# Patient Record
Sex: Female | Born: 1945 | Race: Black or African American | Hispanic: No | Smoking: Never smoker
Health system: Southern US, Community
[De-identification: ages and names within clinical notes are randomized; demographics above are authoritative.]

## PROBLEM LIST (undated history)

## (undated) DIAGNOSIS — C50911 Malignant neoplasm of unspecified site of right female breast: Secondary | ICD-10-CM

## (undated) DIAGNOSIS — E785 Hyperlipidemia, unspecified: Secondary | ICD-10-CM

## (undated) DIAGNOSIS — I10 Essential (primary) hypertension: Secondary | ICD-10-CM

## (undated) DIAGNOSIS — I2510 Atherosclerotic heart disease of native coronary artery without angina pectoris: Secondary | ICD-10-CM

## (undated) DIAGNOSIS — Z9861 Coronary angioplasty status: Secondary | ICD-10-CM

## (undated) HISTORY — DX: Malignant neoplasm of unspecified site of right female breast: C50.911

## (undated) HISTORY — DX: Atherosclerotic heart disease of native coronary artery without angina pectoris: Z98.61

## (undated) HISTORY — PX: TONSILLECTOMY: SUR1361

## (undated) HISTORY — DX: Atherosclerotic heart disease of native coronary artery without angina pectoris: I25.10

---

## 2011-08-23 ENCOUNTER — Emergency Department (HOSPITAL_BASED_OUTPATIENT_CLINIC_OR_DEPARTMENT_OTHER)
Admission: EM | Admit: 2011-08-23 | Discharge: 2011-08-23 | Disposition: A | Discharge status: Home / Self Care | Payer: BC Managed Care – PPO | Attending: Emergency Medicine | Admitting: Emergency Medicine

## 2011-08-23 ENCOUNTER — Encounter (HOSPITAL_BASED_OUTPATIENT_CLINIC_OR_DEPARTMENT_OTHER): Payer: Self-pay | Admitting: *Deleted

## 2011-08-23 DIAGNOSIS — H6690 Otitis media, unspecified, unspecified ear: Secondary | ICD-10-CM | POA: Insufficient documentation

## 2011-08-23 DIAGNOSIS — E119 Type 2 diabetes mellitus without complications: Secondary | ICD-10-CM | POA: Insufficient documentation

## 2011-08-23 DIAGNOSIS — H6692 Otitis media, unspecified, left ear: Secondary | ICD-10-CM

## 2011-08-23 DIAGNOSIS — J069 Acute upper respiratory infection, unspecified: Secondary | ICD-10-CM

## 2011-08-23 DIAGNOSIS — Z79899 Other long term (current) drug therapy: Secondary | ICD-10-CM | POA: Insufficient documentation

## 2011-08-23 DIAGNOSIS — I10 Essential (primary) hypertension: Secondary | ICD-10-CM | POA: Insufficient documentation

## 2011-08-23 DIAGNOSIS — E785 Hyperlipidemia, unspecified: Secondary | ICD-10-CM | POA: Insufficient documentation

## 2011-08-23 HISTORY — DX: Essential (primary) hypertension: I10

## 2011-08-23 HISTORY — DX: Hyperlipidemia, unspecified: E78.5

## 2011-08-23 MED ORDER — ANTIPYRINE-BENZOCAINE 5.4-1.4 % OT SOLN: 3.0000 [drp] | OTIC | Status: AC | PRN

## 2011-08-23 NOTE — ED Notes (Signed)
Pt c/o sore throat and bil ear pain x 2 days

## 2011-08-23 NOTE — ED Provider Notes (Signed)
History     CSN: [id number]  Arrival date & time 08/23/11  4098   First MD Initiated Contact with Patient 08/23/11 2120      Chief Complaint  Patient presents with  . Otalgia  . Sore Throat    (Consider location/radiation/quality/duration/timing/severity/associated sxs/prior treatment) HPI Patient presents with complaint of sore throat and left-sided ear pain. She states her symptoms began approximately 2 days ago. The pain in her left ear was worse last night. It is described as constant and sharp. She denies any fever. She has had no cough or difficulty breathing or difficulty swallowing. Patient states that she had Zpack which her physician had given her for another separate illness. She states that she took the first dose last night and a second dose today.  There are no other associated systemic symptoms.  There are no alleviating or modifying factors.   Past Medical History  Diagnosis Date  . Hypertension   . Diabetes mellitus   . Hyperlipemia     Past Surgical History  Procedure Date  . Tonsillectomy     History reviewed. No pertinent family history.  History  Substance Use Topics  . Smoking status: Never Smoker   . Smokeless tobacco: Not on file  . Alcohol Use: No    OB History    Grav Para Term Preterm Abortions TAB SAB Ect Mult Living                  Review of Systems ROS reviewed and otherwise negative except for mentioned in HPI  Allergies  Review of patient's allergies indicates no known allergies.  Home Medications   Current Outpatient Rx  Name Route Sig Dispense Refill  . AZITHROMYCIN 250 MG PO TABS Oral Take 250 mg by mouth daily.    Marland Kitchen CALCIUM CARBONATE-VITAMIN D 600-400 MG-UNIT PO TABS Oral Take 1 tablet by mouth daily.    Marland Kitchen CINNAMON 500 MG PO TABS Oral Take 500 mg by mouth daily.    . OMEGA-3 FATTY ACIDS 1000 MG PO CAPS Oral Take 1 g by mouth daily.    Marland Kitchen GLIMEPIRIDE 2 MG PO TABS Oral Take 2 mg by mouth 2 (two) times daily.    Marland Kitchen  LEVOTHYROXINE SODIUM 137 MCG PO TABS Oral Take 137 mcg by mouth daily.    Marland Kitchen LORATADINE 10 MG PO TABS Oral Take 10 mg by mouth daily as needed. For allergies    . LOSARTAN POTASSIUM-HCTZ 100-25 MG PO TABS Oral Take 1 tablet by mouth daily.    Marland Kitchen POTASSIUM CHLORIDE ER 10 MEQ PO TBCR Oral Take 10 mEq by mouth 2 (two) times daily.    Marland Kitchen SIMVASTATIN 20 MG PO TABS Oral Take 20 mg by mouth every evening.    Marland Kitchen VERAPAMIL HCL ER (CO) 180 MG PO TB24 Oral Take 180 mg by mouth daily.    Marland Kitchen VITAMIN E 400 UNITS PO CAPS Oral Take 400 Units by mouth daily.    . ANTIPYRINE-BENZOCAINE 5.4-1.4 % OT SOLN Left Ear Place 3 drops into the left ear every 2 (two) hours as needed for pain. 10 mL 0    BP 168/76  Pulse 86  Temp(Src) 98.3 F (36.8 C) (Oral)  Resp 16  Ht 5\' 5"  (1.651 m)  Wt 230 lb (104.327 kg)  BMI 38.27 kg/m2  SpO2 98% Vitals reviewed Physical Exam Physical Examination: General appearance - alert, well appearing, and in no distress Mental status - alert, oriented to person, place, and time Eyes - pupils  equal and reactive, extraocular eye movements intact Ears - left TM with erythema, pus, bulging, decreased landmarks, right TM, bilateral EACs normal Mouth - MMM, mild erythema of OP, palate symmetric, uvula midline Neck - supple, no significant adenopathy Chest - clear to auscultation, no wheezes, rales or rhonchi, symmetric air entry Heart - normal rate, regular rhythm, normal S1, S2, no murmurs, rubs, clicks or gallops Abdomen - soft, nontender, nondistended, no masses or organomegaly Extremities - peripheral pulses normal, no pedal edema, no clubbing or cyanosis Skin - normal coloration and turgor, no rashes  ED Course  Procedures (including critical care time)   Labs Reviewed  RAPID STREP SCREEN   No results found.   1. Otitis media of left ear   2. Upper respiratory infection       MDM  Patient presents with complaint of sore throat and ear pain. She has evidence of an acute  otitis media on the left. Her rapid strep screen was checked and is negative. Patient is already taking Zithromax which she received for another illness today is day #2 on her course. Will discharge her with pain medication and Auralgan for her ear pain and to finish the course of Zithromax. She was given strict return precautions and she is agreeable with this plan        Ethelda Chick, MD 08/24/11 1505

## 2013-02-15 DIAGNOSIS — I213 ST elevation (STEMI) myocardial infarction of unspecified site: Secondary | ICD-10-CM | POA: Insufficient documentation

## 2014-08-12 DIAGNOSIS — Z90710 Acquired absence of both cervix and uterus: Secondary | ICD-10-CM | POA: Insufficient documentation

## 2015-03-20 DIAGNOSIS — E119 Type 2 diabetes mellitus without complications: Secondary | ICD-10-CM | POA: Insufficient documentation

## 2015-03-20 DIAGNOSIS — E1165 Type 2 diabetes mellitus with hyperglycemia: Secondary | ICD-10-CM | POA: Insufficient documentation

## 2015-03-20 DIAGNOSIS — M65331 Trigger finger, right middle finger: Secondary | ICD-10-CM | POA: Insufficient documentation

## 2017-10-23 ENCOUNTER — Other Ambulatory Visit: Payer: Self-pay | Admitting: Internal Medicine

## 2017-10-23 DIAGNOSIS — R944 Abnormal results of kidney function studies: Secondary | ICD-10-CM

## 2017-10-26 ENCOUNTER — Ambulatory Visit
Admission: RE | Admit: 2017-10-26 | Discharge: 2017-10-26 | Disposition: A | Discharge status: Home / Self Care | Payer: Medicare HMO | Source: Ambulatory Visit | Attending: Internal Medicine | Admitting: Internal Medicine

## 2017-10-26 DIAGNOSIS — R944 Abnormal results of kidney function studies: Secondary | ICD-10-CM | POA: Insufficient documentation

## 2020-05-29 DIAGNOSIS — C50311 Malignant neoplasm of lower-inner quadrant of right female breast: Secondary | ICD-10-CM | POA: Insufficient documentation

## 2020-07-18 DIAGNOSIS — T451X5A Adverse effect of antineoplastic and immunosuppressive drugs, initial encounter: Secondary | ICD-10-CM | POA: Insufficient documentation

## 2020-07-18 DIAGNOSIS — R739 Hyperglycemia, unspecified: Secondary | ICD-10-CM | POA: Insufficient documentation

## 2020-09-25 DIAGNOSIS — E876 Hypokalemia: Secondary | ICD-10-CM | POA: Insufficient documentation

## 2020-12-08 DIAGNOSIS — E538 Deficiency of other specified B group vitamins: Secondary | ICD-10-CM | POA: Insufficient documentation

## 2020-12-10 DIAGNOSIS — E538 Deficiency of other specified B group vitamins: Secondary | ICD-10-CM | POA: Diagnosis not present

## 2020-12-10 DIAGNOSIS — C50311 Malignant neoplasm of lower-inner quadrant of right female breast: Secondary | ICD-10-CM | POA: Diagnosis not present

## 2020-12-10 DIAGNOSIS — R6889 Other general symptoms and signs: Secondary | ICD-10-CM | POA: Diagnosis not present

## 2020-12-10 DIAGNOSIS — Z510 Encounter for antineoplastic radiation therapy: Secondary | ICD-10-CM | POA: Diagnosis not present

## 2020-12-10 DIAGNOSIS — Z170 Estrogen receptor positive status [ER+]: Secondary | ICD-10-CM | POA: Diagnosis not present

## 2020-12-11 DIAGNOSIS — Z95828 Presence of other vascular implants and grafts: Secondary | ICD-10-CM | POA: Diagnosis not present

## 2020-12-11 DIAGNOSIS — E538 Deficiency of other specified B group vitamins: Secondary | ICD-10-CM | POA: Diagnosis not present

## 2020-12-11 DIAGNOSIS — C50311 Malignant neoplasm of lower-inner quadrant of right female breast: Secondary | ICD-10-CM | POA: Diagnosis not present

## 2020-12-11 DIAGNOSIS — Z170 Estrogen receptor positive status [ER+]: Secondary | ICD-10-CM | POA: Diagnosis not present

## 2020-12-14 DIAGNOSIS — C50311 Malignant neoplasm of lower-inner quadrant of right female breast: Secondary | ICD-10-CM | POA: Diagnosis not present

## 2020-12-14 DIAGNOSIS — Z170 Estrogen receptor positive status [ER+]: Secondary | ICD-10-CM | POA: Diagnosis not present

## 2020-12-14 DIAGNOSIS — Z510 Encounter for antineoplastic radiation therapy: Secondary | ICD-10-CM | POA: Diagnosis not present

## 2020-12-14 DIAGNOSIS — E538 Deficiency of other specified B group vitamins: Secondary | ICD-10-CM | POA: Diagnosis not present

## 2020-12-15 DIAGNOSIS — C50311 Malignant neoplasm of lower-inner quadrant of right female breast: Secondary | ICD-10-CM | POA: Diagnosis not present

## 2020-12-15 DIAGNOSIS — Z170 Estrogen receptor positive status [ER+]: Secondary | ICD-10-CM | POA: Diagnosis not present

## 2020-12-15 DIAGNOSIS — E538 Deficiency of other specified B group vitamins: Secondary | ICD-10-CM | POA: Diagnosis not present

## 2020-12-15 DIAGNOSIS — Z510 Encounter for antineoplastic radiation therapy: Secondary | ICD-10-CM | POA: Diagnosis not present

## 2020-12-16 DIAGNOSIS — C50311 Malignant neoplasm of lower-inner quadrant of right female breast: Secondary | ICD-10-CM | POA: Diagnosis not present

## 2020-12-16 DIAGNOSIS — Z170 Estrogen receptor positive status [ER+]: Secondary | ICD-10-CM | POA: Diagnosis not present

## 2020-12-16 DIAGNOSIS — Z510 Encounter for antineoplastic radiation therapy: Secondary | ICD-10-CM | POA: Diagnosis not present

## 2020-12-17 DIAGNOSIS — Z170 Estrogen receptor positive status [ER+]: Secondary | ICD-10-CM | POA: Diagnosis not present

## 2020-12-17 DIAGNOSIS — C50311 Malignant neoplasm of lower-inner quadrant of right female breast: Secondary | ICD-10-CM | POA: Diagnosis not present

## 2020-12-17 DIAGNOSIS — Z510 Encounter for antineoplastic radiation therapy: Secondary | ICD-10-CM | POA: Diagnosis not present

## 2020-12-17 DIAGNOSIS — E538 Deficiency of other specified B group vitamins: Secondary | ICD-10-CM | POA: Diagnosis not present

## 2020-12-18 DIAGNOSIS — C50311 Malignant neoplasm of lower-inner quadrant of right female breast: Secondary | ICD-10-CM | POA: Diagnosis not present

## 2020-12-18 DIAGNOSIS — Z510 Encounter for antineoplastic radiation therapy: Secondary | ICD-10-CM | POA: Diagnosis not present

## 2020-12-18 DIAGNOSIS — Z170 Estrogen receptor positive status [ER+]: Secondary | ICD-10-CM | POA: Diagnosis not present

## 2020-12-21 DIAGNOSIS — Z170 Estrogen receptor positive status [ER+]: Secondary | ICD-10-CM | POA: Diagnosis not present

## 2020-12-21 DIAGNOSIS — Z510 Encounter for antineoplastic radiation therapy: Secondary | ICD-10-CM | POA: Diagnosis not present

## 2020-12-21 DIAGNOSIS — C50311 Malignant neoplasm of lower-inner quadrant of right female breast: Secondary | ICD-10-CM | POA: Diagnosis not present

## 2020-12-22 DIAGNOSIS — C50311 Malignant neoplasm of lower-inner quadrant of right female breast: Secondary | ICD-10-CM | POA: Diagnosis not present

## 2020-12-22 DIAGNOSIS — Z170 Estrogen receptor positive status [ER+]: Secondary | ICD-10-CM | POA: Diagnosis not present

## 2020-12-22 DIAGNOSIS — Z510 Encounter for antineoplastic radiation therapy: Secondary | ICD-10-CM | POA: Diagnosis not present

## 2020-12-23 DIAGNOSIS — Z170 Estrogen receptor positive status [ER+]: Secondary | ICD-10-CM | POA: Diagnosis not present

## 2020-12-23 DIAGNOSIS — C50311 Malignant neoplasm of lower-inner quadrant of right female breast: Secondary | ICD-10-CM | POA: Diagnosis not present

## 2020-12-23 DIAGNOSIS — Z510 Encounter for antineoplastic radiation therapy: Secondary | ICD-10-CM | POA: Diagnosis not present

## 2020-12-24 DIAGNOSIS — Z510 Encounter for antineoplastic radiation therapy: Secondary | ICD-10-CM | POA: Diagnosis not present

## 2020-12-24 DIAGNOSIS — C50311 Malignant neoplasm of lower-inner quadrant of right female breast: Secondary | ICD-10-CM | POA: Diagnosis not present

## 2020-12-24 DIAGNOSIS — Z170 Estrogen receptor positive status [ER+]: Secondary | ICD-10-CM | POA: Diagnosis not present

## 2020-12-24 DIAGNOSIS — E538 Deficiency of other specified B group vitamins: Secondary | ICD-10-CM | POA: Diagnosis not present

## 2020-12-25 DIAGNOSIS — Z170 Estrogen receptor positive status [ER+]: Secondary | ICD-10-CM | POA: Diagnosis not present

## 2020-12-25 DIAGNOSIS — Z510 Encounter for antineoplastic radiation therapy: Secondary | ICD-10-CM | POA: Diagnosis not present

## 2020-12-25 DIAGNOSIS — C50311 Malignant neoplasm of lower-inner quadrant of right female breast: Secondary | ICD-10-CM | POA: Diagnosis not present

## 2020-12-28 DIAGNOSIS — Z170 Estrogen receptor positive status [ER+]: Secondary | ICD-10-CM | POA: Diagnosis not present

## 2020-12-28 DIAGNOSIS — Z01810 Encounter for preprocedural cardiovascular examination: Secondary | ICD-10-CM | POA: Diagnosis not present

## 2020-12-28 DIAGNOSIS — Z510 Encounter for antineoplastic radiation therapy: Secondary | ICD-10-CM | POA: Diagnosis not present

## 2020-12-28 DIAGNOSIS — C50311 Malignant neoplasm of lower-inner quadrant of right female breast: Secondary | ICD-10-CM | POA: Diagnosis not present

## 2020-12-29 DIAGNOSIS — Z01810 Encounter for preprocedural cardiovascular examination: Secondary | ICD-10-CM | POA: Diagnosis not present

## 2020-12-29 DIAGNOSIS — C50311 Malignant neoplasm of lower-inner quadrant of right female breast: Secondary | ICD-10-CM | POA: Diagnosis not present

## 2020-12-29 DIAGNOSIS — Z170 Estrogen receptor positive status [ER+]: Secondary | ICD-10-CM | POA: Diagnosis not present

## 2020-12-29 DIAGNOSIS — Z510 Encounter for antineoplastic radiation therapy: Secondary | ICD-10-CM | POA: Diagnosis not present

## 2020-12-30 DIAGNOSIS — Z170 Estrogen receptor positive status [ER+]: Secondary | ICD-10-CM | POA: Diagnosis not present

## 2020-12-30 DIAGNOSIS — M65331 Trigger finger, right middle finger: Secondary | ICD-10-CM | POA: Diagnosis not present

## 2020-12-30 DIAGNOSIS — C50311 Malignant neoplasm of lower-inner quadrant of right female breast: Secondary | ICD-10-CM | POA: Diagnosis not present

## 2020-12-30 DIAGNOSIS — Z510 Encounter for antineoplastic radiation therapy: Secondary | ICD-10-CM | POA: Diagnosis not present

## 2020-12-31 DIAGNOSIS — C50311 Malignant neoplasm of lower-inner quadrant of right female breast: Secondary | ICD-10-CM | POA: Diagnosis not present

## 2020-12-31 DIAGNOSIS — Z510 Encounter for antineoplastic radiation therapy: Secondary | ICD-10-CM | POA: Diagnosis not present

## 2020-12-31 DIAGNOSIS — Z170 Estrogen receptor positive status [ER+]: Secondary | ICD-10-CM | POA: Diagnosis not present

## 2021-01-01 DIAGNOSIS — Z170 Estrogen receptor positive status [ER+]: Secondary | ICD-10-CM | POA: Diagnosis not present

## 2021-01-01 DIAGNOSIS — C50311 Malignant neoplasm of lower-inner quadrant of right female breast: Secondary | ICD-10-CM | POA: Diagnosis not present

## 2021-01-01 DIAGNOSIS — E538 Deficiency of other specified B group vitamins: Secondary | ICD-10-CM | POA: Diagnosis not present

## 2021-01-01 DIAGNOSIS — Z95828 Presence of other vascular implants and grafts: Secondary | ICD-10-CM | POA: Diagnosis not present

## 2021-01-01 DIAGNOSIS — Z510 Encounter for antineoplastic radiation therapy: Secondary | ICD-10-CM | POA: Diagnosis not present

## 2021-01-04 DIAGNOSIS — Z510 Encounter for antineoplastic radiation therapy: Secondary | ICD-10-CM | POA: Diagnosis not present

## 2021-01-04 DIAGNOSIS — C50311 Malignant neoplasm of lower-inner quadrant of right female breast: Secondary | ICD-10-CM | POA: Diagnosis not present

## 2021-01-04 DIAGNOSIS — Z170 Estrogen receptor positive status [ER+]: Secondary | ICD-10-CM | POA: Diagnosis not present

## 2021-01-05 DIAGNOSIS — C50311 Malignant neoplasm of lower-inner quadrant of right female breast: Secondary | ICD-10-CM | POA: Diagnosis not present

## 2021-01-05 DIAGNOSIS — Z170 Estrogen receptor positive status [ER+]: Secondary | ICD-10-CM | POA: Diagnosis not present

## 2021-01-05 DIAGNOSIS — Z510 Encounter for antineoplastic radiation therapy: Secondary | ICD-10-CM | POA: Diagnosis not present

## 2021-01-06 DIAGNOSIS — Z510 Encounter for antineoplastic radiation therapy: Secondary | ICD-10-CM | POA: Diagnosis not present

## 2021-01-06 DIAGNOSIS — Z170 Estrogen receptor positive status [ER+]: Secondary | ICD-10-CM | POA: Diagnosis not present

## 2021-01-06 DIAGNOSIS — C50311 Malignant neoplasm of lower-inner quadrant of right female breast: Secondary | ICD-10-CM | POA: Diagnosis not present

## 2021-01-07 DIAGNOSIS — Z510 Encounter for antineoplastic radiation therapy: Secondary | ICD-10-CM | POA: Diagnosis not present

## 2021-01-07 DIAGNOSIS — Z170 Estrogen receptor positive status [ER+]: Secondary | ICD-10-CM | POA: Diagnosis not present

## 2021-01-07 DIAGNOSIS — C50311 Malignant neoplasm of lower-inner quadrant of right female breast: Secondary | ICD-10-CM | POA: Diagnosis not present

## 2021-01-08 DIAGNOSIS — C50811 Malignant neoplasm of overlapping sites of right female breast: Secondary | ICD-10-CM | POA: Diagnosis not present

## 2021-01-08 DIAGNOSIS — Z510 Encounter for antineoplastic radiation therapy: Secondary | ICD-10-CM | POA: Diagnosis not present

## 2021-01-08 DIAGNOSIS — C50311 Malignant neoplasm of lower-inner quadrant of right female breast: Secondary | ICD-10-CM | POA: Diagnosis not present

## 2021-01-08 DIAGNOSIS — E538 Deficiency of other specified B group vitamins: Secondary | ICD-10-CM | POA: Diagnosis not present

## 2021-01-08 DIAGNOSIS — Z170 Estrogen receptor positive status [ER+]: Secondary | ICD-10-CM | POA: Diagnosis not present

## 2021-01-15 DIAGNOSIS — E538 Deficiency of other specified B group vitamins: Secondary | ICD-10-CM | POA: Diagnosis not present

## 2021-01-18 DIAGNOSIS — Z510 Encounter for antineoplastic radiation therapy: Secondary | ICD-10-CM | POA: Diagnosis not present

## 2021-01-18 DIAGNOSIS — Z170 Estrogen receptor positive status [ER+]: Secondary | ICD-10-CM | POA: Diagnosis not present

## 2021-01-18 DIAGNOSIS — C50311 Malignant neoplasm of lower-inner quadrant of right female breast: Secondary | ICD-10-CM | POA: Diagnosis not present

## 2021-01-22 DIAGNOSIS — Z170 Estrogen receptor positive status [ER+]: Secondary | ICD-10-CM | POA: Diagnosis not present

## 2021-01-22 DIAGNOSIS — E538 Deficiency of other specified B group vitamins: Secondary | ICD-10-CM | POA: Diagnosis not present

## 2021-01-22 DIAGNOSIS — C50311 Malignant neoplasm of lower-inner quadrant of right female breast: Secondary | ICD-10-CM | POA: Diagnosis not present

## 2021-01-22 DIAGNOSIS — Z95828 Presence of other vascular implants and grafts: Secondary | ICD-10-CM | POA: Diagnosis not present

## 2021-01-28 DIAGNOSIS — M21611 Bunion of right foot: Secondary | ICD-10-CM | POA: Diagnosis not present

## 2021-01-29 DIAGNOSIS — E538 Deficiency of other specified B group vitamins: Secondary | ICD-10-CM | POA: Diagnosis not present

## 2021-02-04 DIAGNOSIS — E538 Deficiency of other specified B group vitamins: Secondary | ICD-10-CM | POA: Diagnosis not present

## 2021-02-12 DIAGNOSIS — Z5111 Encounter for antineoplastic chemotherapy: Secondary | ICD-10-CM | POA: Diagnosis not present

## 2021-02-12 DIAGNOSIS — C50311 Malignant neoplasm of lower-inner quadrant of right female breast: Secondary | ICD-10-CM | POA: Diagnosis not present

## 2021-02-12 DIAGNOSIS — Z95828 Presence of other vascular implants and grafts: Secondary | ICD-10-CM | POA: Diagnosis not present

## 2021-02-12 DIAGNOSIS — C170 Malignant neoplasm of duodenum: Secondary | ICD-10-CM | POA: Diagnosis not present

## 2021-02-19 DIAGNOSIS — E039 Hypothyroidism, unspecified: Secondary | ICD-10-CM | POA: Diagnosis not present

## 2021-02-19 DIAGNOSIS — I2510 Atherosclerotic heart disease of native coronary artery without angina pectoris: Secondary | ICD-10-CM | POA: Diagnosis not present

## 2021-02-19 DIAGNOSIS — E139 Other specified diabetes mellitus without complications: Secondary | ICD-10-CM | POA: Diagnosis not present

## 2021-02-19 DIAGNOSIS — I10 Essential (primary) hypertension: Secondary | ICD-10-CM | POA: Diagnosis not present

## 2021-02-19 DIAGNOSIS — E782 Mixed hyperlipidemia: Secondary | ICD-10-CM | POA: Diagnosis not present

## 2021-02-19 DIAGNOSIS — E538 Deficiency of other specified B group vitamins: Secondary | ICD-10-CM | POA: Diagnosis not present

## 2021-03-10 DIAGNOSIS — Z170 Estrogen receptor positive status [ER+]: Secondary | ICD-10-CM | POA: Diagnosis not present

## 2021-03-10 DIAGNOSIS — G629 Polyneuropathy, unspecified: Secondary | ICD-10-CM | POA: Diagnosis not present

## 2021-03-10 DIAGNOSIS — Z95828 Presence of other vascular implants and grafts: Secondary | ICD-10-CM | POA: Diagnosis not present

## 2021-03-10 DIAGNOSIS — C50311 Malignant neoplasm of lower-inner quadrant of right female breast: Secondary | ICD-10-CM | POA: Diagnosis not present

## 2021-03-25 DIAGNOSIS — R5383 Other fatigue: Secondary | ICD-10-CM | POA: Diagnosis not present

## 2021-03-25 DIAGNOSIS — I10 Essential (primary) hypertension: Secondary | ICD-10-CM | POA: Diagnosis not present

## 2021-03-25 DIAGNOSIS — F411 Generalized anxiety disorder: Secondary | ICD-10-CM | POA: Diagnosis not present

## 2021-03-25 DIAGNOSIS — R0602 Shortness of breath: Secondary | ICD-10-CM | POA: Diagnosis not present

## 2021-03-25 DIAGNOSIS — E039 Hypothyroidism, unspecified: Secondary | ICD-10-CM | POA: Diagnosis not present

## 2021-03-25 DIAGNOSIS — I2510 Atherosclerotic heart disease of native coronary artery without angina pectoris: Secondary | ICD-10-CM | POA: Diagnosis not present

## 2021-03-25 DIAGNOSIS — C50919 Malignant neoplasm of unspecified site of unspecified female breast: Secondary | ICD-10-CM | POA: Diagnosis not present

## 2021-03-25 DIAGNOSIS — E782 Mixed hyperlipidemia: Secondary | ICD-10-CM | POA: Diagnosis not present

## 2021-03-25 DIAGNOSIS — E139 Other specified diabetes mellitus without complications: Secondary | ICD-10-CM | POA: Diagnosis not present

## 2021-03-31 DIAGNOSIS — Z170 Estrogen receptor positive status [ER+]: Secondary | ICD-10-CM | POA: Diagnosis not present

## 2021-03-31 DIAGNOSIS — Z95828 Presence of other vascular implants and grafts: Secondary | ICD-10-CM | POA: Diagnosis not present

## 2021-03-31 DIAGNOSIS — G620 Drug-induced polyneuropathy: Secondary | ICD-10-CM | POA: Diagnosis not present

## 2021-03-31 DIAGNOSIS — C50311 Malignant neoplasm of lower-inner quadrant of right female breast: Secondary | ICD-10-CM | POA: Diagnosis not present

## 2021-04-01 DIAGNOSIS — E139 Other specified diabetes mellitus without complications: Secondary | ICD-10-CM | POA: Diagnosis not present

## 2021-04-01 DIAGNOSIS — I429 Cardiomyopathy, unspecified: Secondary | ICD-10-CM | POA: Diagnosis not present

## 2021-04-01 DIAGNOSIS — R0602 Shortness of breath: Secondary | ICD-10-CM | POA: Diagnosis not present

## 2021-04-01 DIAGNOSIS — I2510 Atherosclerotic heart disease of native coronary artery without angina pectoris: Secondary | ICD-10-CM | POA: Diagnosis not present

## 2021-04-01 DIAGNOSIS — I10 Essential (primary) hypertension: Secondary | ICD-10-CM | POA: Diagnosis not present

## 2021-04-01 DIAGNOSIS — E782 Mixed hyperlipidemia: Secondary | ICD-10-CM | POA: Diagnosis not present

## 2021-04-22 DIAGNOSIS — Z5111 Encounter for antineoplastic chemotherapy: Secondary | ICD-10-CM | POA: Diagnosis not present

## 2021-04-22 DIAGNOSIS — C50311 Malignant neoplasm of lower-inner quadrant of right female breast: Secondary | ICD-10-CM | POA: Diagnosis not present

## 2021-04-22 DIAGNOSIS — Z170 Estrogen receptor positive status [ER+]: Secondary | ICD-10-CM | POA: Diagnosis not present

## 2021-04-22 DIAGNOSIS — Z95828 Presence of other vascular implants and grafts: Secondary | ICD-10-CM | POA: Diagnosis not present

## 2021-04-26 DIAGNOSIS — C50311 Malignant neoplasm of lower-inner quadrant of right female breast: Secondary | ICD-10-CM | POA: Diagnosis not present

## 2021-04-26 DIAGNOSIS — Z853 Personal history of malignant neoplasm of breast: Secondary | ICD-10-CM | POA: Diagnosis not present

## 2021-04-26 DIAGNOSIS — R922 Inconclusive mammogram: Secondary | ICD-10-CM | POA: Diagnosis not present

## 2021-04-26 DIAGNOSIS — Z170 Estrogen receptor positive status [ER+]: Secondary | ICD-10-CM | POA: Diagnosis not present

## 2021-05-03 DIAGNOSIS — Z170 Estrogen receptor positive status [ER+]: Secondary | ICD-10-CM | POA: Diagnosis not present

## 2021-05-03 DIAGNOSIS — Z9221 Personal history of antineoplastic chemotherapy: Secondary | ICD-10-CM | POA: Diagnosis not present

## 2021-05-03 DIAGNOSIS — Z923 Personal history of irradiation: Secondary | ICD-10-CM | POA: Diagnosis not present

## 2021-05-03 DIAGNOSIS — C50311 Malignant neoplasm of lower-inner quadrant of right female breast: Secondary | ICD-10-CM | POA: Diagnosis not present

## 2021-05-13 DIAGNOSIS — C50311 Malignant neoplasm of lower-inner quadrant of right female breast: Secondary | ICD-10-CM | POA: Diagnosis not present

## 2021-05-13 DIAGNOSIS — Z170 Estrogen receptor positive status [ER+]: Secondary | ICD-10-CM | POA: Diagnosis not present

## 2021-05-13 DIAGNOSIS — G620 Drug-induced polyneuropathy: Secondary | ICD-10-CM | POA: Diagnosis not present

## 2021-05-13 DIAGNOSIS — Z95828 Presence of other vascular implants and grafts: Secondary | ICD-10-CM | POA: Diagnosis not present

## 2021-05-28 DIAGNOSIS — E039 Hypothyroidism, unspecified: Secondary | ICD-10-CM | POA: Diagnosis not present

## 2021-05-28 DIAGNOSIS — E139 Other specified diabetes mellitus without complications: Secondary | ICD-10-CM | POA: Diagnosis not present

## 2021-05-28 DIAGNOSIS — I10 Essential (primary) hypertension: Secondary | ICD-10-CM | POA: Diagnosis not present

## 2021-05-28 DIAGNOSIS — I2510 Atherosclerotic heart disease of native coronary artery without angina pectoris: Secondary | ICD-10-CM | POA: Diagnosis not present

## 2021-05-28 DIAGNOSIS — E782 Mixed hyperlipidemia: Secondary | ICD-10-CM | POA: Diagnosis not present

## 2021-06-02 DIAGNOSIS — E538 Deficiency of other specified B group vitamins: Secondary | ICD-10-CM | POA: Diagnosis not present

## 2021-06-02 DIAGNOSIS — Z170 Estrogen receptor positive status [ER+]: Secondary | ICD-10-CM | POA: Diagnosis not present

## 2021-06-02 DIAGNOSIS — Z95828 Presence of other vascular implants and grafts: Secondary | ICD-10-CM | POA: Diagnosis not present

## 2021-06-02 DIAGNOSIS — C50311 Malignant neoplasm of lower-inner quadrant of right female breast: Secondary | ICD-10-CM | POA: Diagnosis not present

## 2021-06-02 DIAGNOSIS — G620 Drug-induced polyneuropathy: Secondary | ICD-10-CM | POA: Diagnosis not present

## 2021-06-29 DIAGNOSIS — Z170 Estrogen receptor positive status [ER+]: Secondary | ICD-10-CM | POA: Diagnosis not present

## 2021-06-29 DIAGNOSIS — Z9889 Other specified postprocedural states: Secondary | ICD-10-CM | POA: Diagnosis not present

## 2021-06-29 DIAGNOSIS — T451X5A Adverse effect of antineoplastic and immunosuppressive drugs, initial encounter: Secondary | ICD-10-CM | POA: Diagnosis not present

## 2021-06-29 DIAGNOSIS — Z95828 Presence of other vascular implants and grafts: Secondary | ICD-10-CM | POA: Diagnosis not present

## 2021-06-29 DIAGNOSIS — C50311 Malignant neoplasm of lower-inner quadrant of right female breast: Secondary | ICD-10-CM | POA: Diagnosis not present

## 2021-06-29 DIAGNOSIS — G620 Drug-induced polyneuropathy: Secondary | ICD-10-CM | POA: Diagnosis not present

## 2021-07-16 DIAGNOSIS — E1136 Type 2 diabetes mellitus with diabetic cataract: Secondary | ICD-10-CM | POA: Diagnosis not present

## 2021-07-16 DIAGNOSIS — H04123 Dry eye syndrome of bilateral lacrimal glands: Secondary | ICD-10-CM | POA: Diagnosis not present

## 2021-07-16 DIAGNOSIS — H2513 Age-related nuclear cataract, bilateral: Secondary | ICD-10-CM | POA: Diagnosis not present

## 2021-07-19 DIAGNOSIS — Z170 Estrogen receptor positive status [ER+]: Secondary | ICD-10-CM | POA: Diagnosis not present

## 2021-07-19 DIAGNOSIS — I2510 Atherosclerotic heart disease of native coronary artery without angina pectoris: Secondary | ICD-10-CM | POA: Diagnosis not present

## 2021-07-19 DIAGNOSIS — E039 Hypothyroidism, unspecified: Secondary | ICD-10-CM | POA: Diagnosis not present

## 2021-07-19 DIAGNOSIS — E119 Type 2 diabetes mellitus without complications: Secondary | ICD-10-CM | POA: Diagnosis not present

## 2021-07-19 DIAGNOSIS — Z9221 Personal history of antineoplastic chemotherapy: Secondary | ICD-10-CM | POA: Diagnosis not present

## 2021-07-19 DIAGNOSIS — C50311 Malignant neoplasm of lower-inner quadrant of right female breast: Secondary | ICD-10-CM | POA: Diagnosis not present

## 2021-07-19 DIAGNOSIS — Z452 Encounter for adjustment and management of vascular access device: Secondary | ICD-10-CM | POA: Diagnosis not present

## 2021-07-19 DIAGNOSIS — I10 Essential (primary) hypertension: Secondary | ICD-10-CM | POA: Diagnosis not present

## 2021-07-19 DIAGNOSIS — Z87891 Personal history of nicotine dependence: Secondary | ICD-10-CM | POA: Diagnosis not present

## 2021-07-19 DIAGNOSIS — C50919 Malignant neoplasm of unspecified site of unspecified female breast: Secondary | ICD-10-CM | POA: Diagnosis not present

## 2021-07-30 DIAGNOSIS — M79602 Pain in left arm: Secondary | ICD-10-CM | POA: Diagnosis not present

## 2021-07-30 DIAGNOSIS — R936 Abnormal findings on diagnostic imaging of limbs: Secondary | ICD-10-CM | POA: Diagnosis not present

## 2021-07-30 DIAGNOSIS — I10 Essential (primary) hypertension: Secondary | ICD-10-CM | POA: Diagnosis not present

## 2021-07-30 DIAGNOSIS — E782 Mixed hyperlipidemia: Secondary | ICD-10-CM | POA: Diagnosis not present

## 2021-07-30 DIAGNOSIS — M25812 Other specified joint disorders, left shoulder: Secondary | ICD-10-CM | POA: Diagnosis not present

## 2021-07-30 DIAGNOSIS — E139 Other specified diabetes mellitus without complications: Secondary | ICD-10-CM | POA: Diagnosis not present

## 2021-07-30 DIAGNOSIS — I2510 Atherosclerotic heart disease of native coronary artery without angina pectoris: Secondary | ICD-10-CM | POA: Diagnosis not present

## 2021-07-30 DIAGNOSIS — M19012 Primary osteoarthritis, left shoulder: Secondary | ICD-10-CM | POA: Diagnosis not present

## 2021-08-02 DIAGNOSIS — M65331 Trigger finger, right middle finger: Secondary | ICD-10-CM | POA: Diagnosis not present

## 2021-08-05 DIAGNOSIS — E039 Hypothyroidism, unspecified: Secondary | ICD-10-CM | POA: Diagnosis not present

## 2021-08-05 DIAGNOSIS — I10 Essential (primary) hypertension: Secondary | ICD-10-CM | POA: Diagnosis not present

## 2021-08-05 DIAGNOSIS — I252 Old myocardial infarction: Secondary | ICD-10-CM | POA: Diagnosis not present

## 2021-08-05 DIAGNOSIS — M65331 Trigger finger, right middle finger: Secondary | ICD-10-CM | POA: Diagnosis not present

## 2021-08-05 DIAGNOSIS — Z90710 Acquired absence of both cervix and uterus: Secondary | ICD-10-CM | POA: Diagnosis not present

## 2021-08-05 DIAGNOSIS — E119 Type 2 diabetes mellitus without complications: Secondary | ICD-10-CM | POA: Diagnosis not present

## 2021-08-05 DIAGNOSIS — Z885 Allergy status to narcotic agent status: Secondary | ICD-10-CM | POA: Diagnosis not present

## 2021-08-05 DIAGNOSIS — E785 Hyperlipidemia, unspecified: Secondary | ICD-10-CM | POA: Diagnosis not present

## 2021-08-05 DIAGNOSIS — Z87891 Personal history of nicotine dependence: Secondary | ICD-10-CM | POA: Diagnosis not present

## 2021-09-08 DIAGNOSIS — C50311 Malignant neoplasm of lower-inner quadrant of right female breast: Secondary | ICD-10-CM | POA: Diagnosis not present

## 2021-09-08 DIAGNOSIS — Z170 Estrogen receptor positive status [ER+]: Secondary | ICD-10-CM | POA: Diagnosis not present

## 2021-09-20 DIAGNOSIS — I10 Essential (primary) hypertension: Secondary | ICD-10-CM | POA: Diagnosis not present

## 2021-09-20 DIAGNOSIS — E782 Mixed hyperlipidemia: Secondary | ICD-10-CM | POA: Diagnosis not present

## 2021-09-20 DIAGNOSIS — E139 Other specified diabetes mellitus without complications: Secondary | ICD-10-CM | POA: Diagnosis not present

## 2021-09-20 DIAGNOSIS — I2510 Atherosclerotic heart disease of native coronary artery without angina pectoris: Secondary | ICD-10-CM | POA: Diagnosis not present

## 2021-10-01 DIAGNOSIS — C50311 Malignant neoplasm of lower-inner quadrant of right female breast: Secondary | ICD-10-CM | POA: Diagnosis not present

## 2021-10-01 DIAGNOSIS — G629 Polyneuropathy, unspecified: Secondary | ICD-10-CM | POA: Diagnosis not present

## 2021-10-01 DIAGNOSIS — Z170 Estrogen receptor positive status [ER+]: Secondary | ICD-10-CM | POA: Diagnosis not present

## 2021-10-19 DIAGNOSIS — M25641 Stiffness of right hand, not elsewhere classified: Secondary | ICD-10-CM | POA: Diagnosis not present

## 2021-10-19 DIAGNOSIS — M25541 Pain in joints of right hand: Secondary | ICD-10-CM | POA: Diagnosis not present

## 2021-10-19 DIAGNOSIS — M6530 Trigger finger, unspecified finger: Secondary | ICD-10-CM | POA: Diagnosis not present

## 2021-10-19 DIAGNOSIS — M6281 Muscle weakness (generalized): Secondary | ICD-10-CM | POA: Diagnosis not present

## 2021-10-26 DIAGNOSIS — M25541 Pain in joints of right hand: Secondary | ICD-10-CM | POA: Diagnosis not present

## 2021-10-26 DIAGNOSIS — M6281 Muscle weakness (generalized): Secondary | ICD-10-CM | POA: Diagnosis not present

## 2021-10-26 DIAGNOSIS — M25641 Stiffness of right hand, not elsewhere classified: Secondary | ICD-10-CM | POA: Diagnosis not present

## 2021-10-26 DIAGNOSIS — M6530 Trigger finger, unspecified finger: Secondary | ICD-10-CM | POA: Diagnosis not present

## 2021-10-29 DIAGNOSIS — M6530 Trigger finger, unspecified finger: Secondary | ICD-10-CM | POA: Diagnosis not present

## 2021-10-29 DIAGNOSIS — M6281 Muscle weakness (generalized): Secondary | ICD-10-CM | POA: Diagnosis not present

## 2021-10-29 DIAGNOSIS — M25541 Pain in joints of right hand: Secondary | ICD-10-CM | POA: Diagnosis not present

## 2021-10-29 DIAGNOSIS — M25641 Stiffness of right hand, not elsewhere classified: Secondary | ICD-10-CM | POA: Diagnosis not present

## 2021-11-08 DIAGNOSIS — C50311 Malignant neoplasm of lower-inner quadrant of right female breast: Secondary | ICD-10-CM | POA: Diagnosis not present

## 2021-11-08 DIAGNOSIS — Z9011 Acquired absence of right breast and nipple: Secondary | ICD-10-CM | POA: Diagnosis not present

## 2021-11-08 DIAGNOSIS — Z170 Estrogen receptor positive status [ER+]: Secondary | ICD-10-CM | POA: Diagnosis not present

## 2021-11-08 DIAGNOSIS — Z87891 Personal history of nicotine dependence: Secondary | ICD-10-CM | POA: Diagnosis not present

## 2021-11-08 DIAGNOSIS — Z79811 Long term (current) use of aromatase inhibitors: Secondary | ICD-10-CM | POA: Diagnosis not present

## 2021-11-08 DIAGNOSIS — Z923 Personal history of irradiation: Secondary | ICD-10-CM | POA: Diagnosis not present

## 2021-11-11 DIAGNOSIS — H2513 Age-related nuclear cataract, bilateral: Secondary | ICD-10-CM | POA: Diagnosis not present

## 2021-11-11 DIAGNOSIS — H04203 Unspecified epiphora, bilateral lacrimal glands: Secondary | ICD-10-CM | POA: Diagnosis not present

## 2021-11-11 DIAGNOSIS — H04123 Dry eye syndrome of bilateral lacrimal glands: Secondary | ICD-10-CM | POA: Diagnosis not present

## 2021-11-11 DIAGNOSIS — H52203 Unspecified astigmatism, bilateral: Secondary | ICD-10-CM | POA: Diagnosis not present

## 2021-11-11 DIAGNOSIS — H18513 Endothelial corneal dystrophy, bilateral: Secondary | ICD-10-CM | POA: Diagnosis not present

## 2021-11-11 DIAGNOSIS — H5203 Hypermetropia, bilateral: Secondary | ICD-10-CM | POA: Diagnosis not present

## 2021-11-11 DIAGNOSIS — H524 Presbyopia: Secondary | ICD-10-CM | POA: Diagnosis not present

## 2021-11-29 DIAGNOSIS — I2510 Atherosclerotic heart disease of native coronary artery without angina pectoris: Secondary | ICD-10-CM | POA: Diagnosis not present

## 2021-11-29 DIAGNOSIS — E139 Other specified diabetes mellitus without complications: Secondary | ICD-10-CM | POA: Diagnosis not present

## 2021-11-29 DIAGNOSIS — M5432 Sciatica, left side: Secondary | ICD-10-CM | POA: Diagnosis not present

## 2021-11-29 DIAGNOSIS — E782 Mixed hyperlipidemia: Secondary | ICD-10-CM | POA: Diagnosis not present

## 2021-11-29 DIAGNOSIS — I10 Essential (primary) hypertension: Secondary | ICD-10-CM | POA: Diagnosis not present

## 2021-12-10 ENCOUNTER — Ambulatory Visit
Admission: RE | Admit: 2021-12-10 | Discharge: 2021-12-10 | Disposition: A | Discharge status: Home / Self Care | Payer: Medicare HMO | Attending: Internal Medicine | Admitting: Internal Medicine

## 2021-12-10 ENCOUNTER — Other Ambulatory Visit: Payer: Self-pay | Admitting: Internal Medicine

## 2021-12-10 ENCOUNTER — Ambulatory Visit
Admission: RE | Admit: 2021-12-10 | Discharge: 2021-12-10 | Disposition: A | Discharge status: Home / Self Care | Payer: Medicare HMO | Source: Ambulatory Visit | Attending: Internal Medicine | Admitting: Internal Medicine

## 2021-12-10 DIAGNOSIS — I651 Occlusion and stenosis of basilar artery: Secondary | ICD-10-CM | POA: Diagnosis not present

## 2021-12-10 DIAGNOSIS — R9431 Abnormal electrocardiogram [ECG] [EKG]: Secondary | ICD-10-CM | POA: Diagnosis not present

## 2021-12-10 DIAGNOSIS — M25552 Pain in left hip: Secondary | ICD-10-CM

## 2021-12-10 DIAGNOSIS — Z7902 Long term (current) use of antithrombotics/antiplatelets: Secondary | ICD-10-CM | POA: Diagnosis not present

## 2021-12-10 DIAGNOSIS — I10 Essential (primary) hypertension: Secondary | ICD-10-CM | POA: Diagnosis not present

## 2021-12-10 DIAGNOSIS — E139 Other specified diabetes mellitus without complications: Secondary | ICD-10-CM | POA: Diagnosis not present

## 2021-12-10 DIAGNOSIS — R55 Syncope and collapse: Secondary | ICD-10-CM | POA: Diagnosis not present

## 2021-12-10 DIAGNOSIS — Z87891 Personal history of nicotine dependence: Secondary | ICD-10-CM | POA: Diagnosis not present

## 2021-12-10 DIAGNOSIS — Q283 Other malformations of cerebral vessels: Secondary | ICD-10-CM | POA: Diagnosis not present

## 2021-12-10 DIAGNOSIS — I6523 Occlusion and stenosis of bilateral carotid arteries: Secondary | ICD-10-CM | POA: Diagnosis not present

## 2021-12-10 DIAGNOSIS — M533 Sacrococcygeal disorders, not elsewhere classified: Secondary | ICD-10-CM | POA: Diagnosis not present

## 2021-12-10 DIAGNOSIS — Z7982 Long term (current) use of aspirin: Secondary | ICD-10-CM | POA: Diagnosis not present

## 2021-12-10 DIAGNOSIS — I6501 Occlusion and stenosis of right vertebral artery: Secondary | ICD-10-CM | POA: Diagnosis not present

## 2021-12-10 DIAGNOSIS — E119 Type 2 diabetes mellitus without complications: Secondary | ICD-10-CM | POA: Diagnosis not present

## 2021-12-10 DIAGNOSIS — Z853 Personal history of malignant neoplasm of breast: Secondary | ICD-10-CM | POA: Diagnosis not present

## 2021-12-10 DIAGNOSIS — M1612 Unilateral primary osteoarthritis, left hip: Secondary | ICD-10-CM | POA: Diagnosis not present

## 2021-12-10 DIAGNOSIS — M25752 Osteophyte, left hip: Secondary | ICD-10-CM | POA: Diagnosis not present

## 2021-12-10 DIAGNOSIS — I2510 Atherosclerotic heart disease of native coronary artery without angina pectoris: Secondary | ICD-10-CM | POA: Diagnosis not present

## 2021-12-11 DIAGNOSIS — I2510 Atherosclerotic heart disease of native coronary artery without angina pectoris: Secondary | ICD-10-CM | POA: Diagnosis not present

## 2021-12-11 DIAGNOSIS — I651 Occlusion and stenosis of basilar artery: Secondary | ICD-10-CM | POA: Insufficient documentation

## 2021-12-11 DIAGNOSIS — E119 Type 2 diabetes mellitus without complications: Secondary | ICD-10-CM | POA: Diagnosis not present

## 2021-12-11 DIAGNOSIS — Z7984 Long term (current) use of oral hypoglycemic drugs: Secondary | ICD-10-CM | POA: Diagnosis not present

## 2021-12-11 DIAGNOSIS — Z7982 Long term (current) use of aspirin: Secondary | ICD-10-CM | POA: Diagnosis not present

## 2021-12-11 DIAGNOSIS — R42 Dizziness and giddiness: Secondary | ICD-10-CM | POA: Diagnosis not present

## 2021-12-11 DIAGNOSIS — G9389 Other specified disorders of brain: Secondary | ICD-10-CM | POA: Diagnosis not present

## 2021-12-11 DIAGNOSIS — Z79899 Other long term (current) drug therapy: Secondary | ICD-10-CM | POA: Diagnosis not present

## 2021-12-11 DIAGNOSIS — Z853 Personal history of malignant neoplasm of breast: Secondary | ICD-10-CM | POA: Insufficient documentation

## 2021-12-11 DIAGNOSIS — I10 Essential (primary) hypertension: Secondary | ICD-10-CM | POA: Diagnosis not present

## 2021-12-11 DIAGNOSIS — R61 Generalized hyperhidrosis: Secondary | ICD-10-CM | POA: Diagnosis not present

## 2021-12-11 DIAGNOSIS — R55 Syncope and collapse: Secondary | ICD-10-CM | POA: Diagnosis not present

## 2021-12-11 DIAGNOSIS — Z7902 Long term (current) use of antithrombotics/antiplatelets: Secondary | ICD-10-CM | POA: Diagnosis not present

## 2021-12-12 DIAGNOSIS — I498 Other specified cardiac arrhythmias: Secondary | ICD-10-CM | POA: Diagnosis not present

## 2021-12-13 DIAGNOSIS — I517 Cardiomegaly: Secondary | ICD-10-CM | POA: Diagnosis not present

## 2021-12-29 DIAGNOSIS — I472 Ventricular tachycardia, unspecified: Secondary | ICD-10-CM | POA: Diagnosis not present

## 2021-12-29 DIAGNOSIS — I471 Supraventricular tachycardia: Secondary | ICD-10-CM | POA: Diagnosis not present

## 2021-12-29 DIAGNOSIS — R6889 Other general symptoms and signs: Secondary | ICD-10-CM | POA: Diagnosis not present

## 2022-01-05 DIAGNOSIS — R55 Syncope and collapse: Secondary | ICD-10-CM | POA: Diagnosis not present

## 2022-01-05 DIAGNOSIS — R6889 Other general symptoms and signs: Secondary | ICD-10-CM | POA: Diagnosis not present

## 2022-01-05 DIAGNOSIS — E1165 Type 2 diabetes mellitus with hyperglycemia: Secondary | ICD-10-CM | POA: Diagnosis not present

## 2022-01-19 DIAGNOSIS — R42 Dizziness and giddiness: Secondary | ICD-10-CM | POA: Diagnosis not present

## 2022-02-01 DIAGNOSIS — E139 Other specified diabetes mellitus without complications: Secondary | ICD-10-CM | POA: Diagnosis not present

## 2022-02-01 DIAGNOSIS — I10 Essential (primary) hypertension: Secondary | ICD-10-CM | POA: Diagnosis not present

## 2022-02-01 DIAGNOSIS — E782 Mixed hyperlipidemia: Secondary | ICD-10-CM | POA: Diagnosis not present

## 2022-03-10 DIAGNOSIS — I2510 Atherosclerotic heart disease of native coronary artery without angina pectoris: Secondary | ICD-10-CM | POA: Diagnosis not present

## 2022-03-10 DIAGNOSIS — E139 Other specified diabetes mellitus without complications: Secondary | ICD-10-CM | POA: Diagnosis not present

## 2022-03-10 DIAGNOSIS — E782 Mixed hyperlipidemia: Secondary | ICD-10-CM | POA: Diagnosis not present

## 2022-03-10 DIAGNOSIS — I10 Essential (primary) hypertension: Secondary | ICD-10-CM | POA: Diagnosis not present

## 2022-03-10 DIAGNOSIS — E038 Other specified hypothyroidism: Secondary | ICD-10-CM | POA: Diagnosis not present

## 2022-04-04 DIAGNOSIS — Z170 Estrogen receptor positive status [ER+]: Secondary | ICD-10-CM | POA: Diagnosis not present

## 2022-04-04 DIAGNOSIS — Z9221 Personal history of antineoplastic chemotherapy: Secondary | ICD-10-CM | POA: Diagnosis not present

## 2022-04-04 DIAGNOSIS — G629 Polyneuropathy, unspecified: Secondary | ICD-10-CM | POA: Diagnosis not present

## 2022-04-04 DIAGNOSIS — C50311 Malignant neoplasm of lower-inner quadrant of right female breast: Secondary | ICD-10-CM | POA: Diagnosis not present

## 2022-04-04 DIAGNOSIS — Z79811 Long term (current) use of aromatase inhibitors: Secondary | ICD-10-CM | POA: Diagnosis not present

## 2022-04-05 DIAGNOSIS — M722 Plantar fascial fibromatosis: Secondary | ICD-10-CM | POA: Diagnosis not present

## 2022-04-05 DIAGNOSIS — M6701 Short Achilles tendon (acquired), right ankle: Secondary | ICD-10-CM | POA: Diagnosis not present

## 2022-04-14 DIAGNOSIS — M722 Plantar fascial fibromatosis: Secondary | ICD-10-CM | POA: Diagnosis not present

## 2022-04-14 DIAGNOSIS — M6701 Short Achilles tendon (acquired), right ankle: Secondary | ICD-10-CM | POA: Diagnosis not present

## 2022-05-09 DIAGNOSIS — C50919 Malignant neoplasm of unspecified site of unspecified female breast: Secondary | ICD-10-CM | POA: Diagnosis not present

## 2022-05-09 DIAGNOSIS — Z91040 Latex allergy status: Secondary | ICD-10-CM | POA: Diagnosis not present

## 2022-05-09 DIAGNOSIS — Z170 Estrogen receptor positive status [ER+]: Secondary | ICD-10-CM | POA: Diagnosis not present

## 2022-05-09 DIAGNOSIS — C50311 Malignant neoplasm of lower-inner quadrant of right female breast: Secondary | ICD-10-CM | POA: Diagnosis not present

## 2022-05-09 DIAGNOSIS — Z888 Allergy status to other drugs, medicaments and biological substances status: Secondary | ICD-10-CM | POA: Diagnosis not present

## 2022-05-09 DIAGNOSIS — Z885 Allergy status to narcotic agent status: Secondary | ICD-10-CM | POA: Diagnosis not present

## 2022-05-10 DIAGNOSIS — I10 Essential (primary) hypertension: Secondary | ICD-10-CM | POA: Diagnosis not present

## 2022-05-10 DIAGNOSIS — E119 Type 2 diabetes mellitus without complications: Secondary | ICD-10-CM | POA: Diagnosis not present

## 2022-05-10 DIAGNOSIS — I2510 Atherosclerotic heart disease of native coronary artery without angina pectoris: Secondary | ICD-10-CM | POA: Diagnosis not present

## 2022-05-16 DIAGNOSIS — M6701 Short Achilles tendon (acquired), right ankle: Secondary | ICD-10-CM | POA: Diagnosis not present

## 2022-05-16 DIAGNOSIS — M722 Plantar fascial fibromatosis: Secondary | ICD-10-CM | POA: Diagnosis not present

## 2022-05-18 DIAGNOSIS — Z780 Asymptomatic menopausal state: Secondary | ICD-10-CM | POA: Diagnosis not present

## 2022-05-18 DIAGNOSIS — M85842 Other specified disorders of bone density and structure, left hand: Secondary | ICD-10-CM | POA: Diagnosis not present

## 2022-05-18 DIAGNOSIS — M85841 Other specified disorders of bone density and structure, right hand: Secondary | ICD-10-CM | POA: Diagnosis not present

## 2022-05-26 DIAGNOSIS — C50311 Malignant neoplasm of lower-inner quadrant of right female breast: Secondary | ICD-10-CM | POA: Diagnosis not present

## 2022-05-26 DIAGNOSIS — Z853 Personal history of malignant neoplasm of breast: Secondary | ICD-10-CM | POA: Diagnosis not present

## 2022-05-26 DIAGNOSIS — Z170 Estrogen receptor positive status [ER+]: Secondary | ICD-10-CM | POA: Diagnosis not present

## 2022-06-09 DIAGNOSIS — I2510 Atherosclerotic heart disease of native coronary artery without angina pectoris: Secondary | ICD-10-CM | POA: Diagnosis not present

## 2022-06-09 DIAGNOSIS — E038 Other specified hypothyroidism: Secondary | ICD-10-CM | POA: Diagnosis not present

## 2022-06-09 DIAGNOSIS — E782 Mixed hyperlipidemia: Secondary | ICD-10-CM | POA: Diagnosis not present

## 2022-06-09 DIAGNOSIS — I10 Essential (primary) hypertension: Secondary | ICD-10-CM | POA: Diagnosis not present

## 2022-06-09 DIAGNOSIS — Z1389 Encounter for screening for other disorder: Secondary | ICD-10-CM | POA: Diagnosis not present

## 2022-06-09 DIAGNOSIS — Z0000 Encounter for general adult medical examination without abnormal findings: Secondary | ICD-10-CM | POA: Diagnosis not present

## 2022-06-09 DIAGNOSIS — E139 Other specified diabetes mellitus without complications: Secondary | ICD-10-CM | POA: Diagnosis not present

## 2022-06-09 DIAGNOSIS — C50911 Malignant neoplasm of unspecified site of right female breast: Secondary | ICD-10-CM | POA: Diagnosis not present

## 2022-07-09 DIAGNOSIS — I10 Essential (primary) hypertension: Secondary | ICD-10-CM | POA: Diagnosis not present

## 2022-07-09 DIAGNOSIS — E119 Type 2 diabetes mellitus without complications: Secondary | ICD-10-CM | POA: Diagnosis not present

## 2022-07-27 DIAGNOSIS — Z885 Allergy status to narcotic agent status: Secondary | ICD-10-CM | POA: Diagnosis not present

## 2022-07-27 DIAGNOSIS — M1711 Unilateral primary osteoarthritis, right knee: Secondary | ICD-10-CM | POA: Diagnosis not present

## 2022-07-27 DIAGNOSIS — M25562 Pain in left knee: Secondary | ICD-10-CM | POA: Diagnosis not present

## 2022-07-27 DIAGNOSIS — E785 Hyperlipidemia, unspecified: Secondary | ICD-10-CM | POA: Diagnosis not present

## 2022-07-27 DIAGNOSIS — Z87891 Personal history of nicotine dependence: Secondary | ICD-10-CM | POA: Diagnosis not present

## 2022-07-27 DIAGNOSIS — I252 Old myocardial infarction: Secondary | ICD-10-CM | POA: Diagnosis not present

## 2022-07-27 DIAGNOSIS — E119 Type 2 diabetes mellitus without complications: Secondary | ICD-10-CM | POA: Diagnosis not present

## 2022-07-27 DIAGNOSIS — S8991XA Unspecified injury of right lower leg, initial encounter: Secondary | ICD-10-CM | POA: Diagnosis not present

## 2022-07-27 DIAGNOSIS — E0789 Other specified disorders of thyroid: Secondary | ICD-10-CM | POA: Diagnosis not present

## 2022-07-27 DIAGNOSIS — I10 Essential (primary) hypertension: Secondary | ICD-10-CM | POA: Diagnosis not present

## 2022-08-02 DIAGNOSIS — E782 Mixed hyperlipidemia: Secondary | ICD-10-CM | POA: Diagnosis not present

## 2022-08-02 DIAGNOSIS — E139 Other specified diabetes mellitus without complications: Secondary | ICD-10-CM | POA: Diagnosis not present

## 2022-08-02 DIAGNOSIS — I2510 Atherosclerotic heart disease of native coronary artery without angina pectoris: Secondary | ICD-10-CM | POA: Diagnosis not present

## 2022-08-02 DIAGNOSIS — R002 Palpitations: Secondary | ICD-10-CM | POA: Diagnosis not present

## 2022-08-02 DIAGNOSIS — R0602 Shortness of breath: Secondary | ICD-10-CM | POA: Diagnosis not present

## 2022-08-05 DIAGNOSIS — Z20822 Contact with and (suspected) exposure to covid-19: Secondary | ICD-10-CM | POA: Diagnosis not present

## 2022-08-05 DIAGNOSIS — R0602 Shortness of breath: Secondary | ICD-10-CM | POA: Diagnosis not present

## 2022-08-05 DIAGNOSIS — R059 Cough, unspecified: Secondary | ICD-10-CM | POA: Diagnosis not present

## 2022-08-15 ENCOUNTER — Other Ambulatory Visit: Payer: Self-pay | Admitting: Cardiovascular Disease

## 2022-08-15 DIAGNOSIS — R002 Palpitations: Secondary | ICD-10-CM

## 2022-08-15 DIAGNOSIS — R0602 Shortness of breath: Secondary | ICD-10-CM

## 2022-08-18 DIAGNOSIS — D701 Agranulocytosis secondary to cancer chemotherapy: Secondary | ICD-10-CM | POA: Diagnosis not present

## 2022-08-18 DIAGNOSIS — M25561 Pain in right knee: Secondary | ICD-10-CM | POA: Diagnosis not present

## 2022-08-18 DIAGNOSIS — C50311 Malignant neoplasm of lower-inner quadrant of right female breast: Secondary | ICD-10-CM | POA: Diagnosis not present

## 2022-08-18 DIAGNOSIS — E119 Type 2 diabetes mellitus without complications: Secondary | ICD-10-CM | POA: Diagnosis not present

## 2022-08-18 DIAGNOSIS — M1711 Unilateral primary osteoarthritis, right knee: Secondary | ICD-10-CM | POA: Diagnosis not present

## 2022-08-18 DIAGNOSIS — T451X5A Adverse effect of antineoplastic and immunosuppressive drugs, initial encounter: Secondary | ICD-10-CM | POA: Diagnosis not present

## 2022-08-18 DIAGNOSIS — Z170 Estrogen receptor positive status [ER+]: Secondary | ICD-10-CM | POA: Diagnosis not present

## 2022-08-19 ENCOUNTER — Other Ambulatory Visit: Payer: Medicare HMO

## 2022-09-08 ENCOUNTER — Ambulatory Visit: Payer: Medicare HMO | Admitting: Cardiovascular Disease

## 2022-09-08 ENCOUNTER — Encounter: Payer: Self-pay | Admitting: Cardiovascular Disease

## 2022-09-08 ENCOUNTER — Ambulatory Visit: Payer: Medicare HMO | Admitting: Internal Medicine

## 2022-09-08 VITALS — BP 152/68 | HR 63 | Ht 66.0 in | Wt 195.0 lb

## 2022-09-08 DIAGNOSIS — E039 Hypothyroidism, unspecified: Secondary | ICD-10-CM | POA: Diagnosis not present

## 2022-09-08 DIAGNOSIS — R002 Palpitations: Secondary | ICD-10-CM | POA: Diagnosis not present

## 2022-09-08 DIAGNOSIS — E782 Mixed hyperlipidemia: Secondary | ICD-10-CM | POA: Diagnosis not present

## 2022-09-08 DIAGNOSIS — I2510 Atherosclerotic heart disease of native coronary artery without angina pectoris: Secondary | ICD-10-CM

## 2022-09-08 DIAGNOSIS — I10 Essential (primary) hypertension: Secondary | ICD-10-CM

## 2022-09-08 NOTE — Progress Notes (Signed)
Cardiology Office Note   Date:  09/08/2022   ID:  Naylin, Perazzo 17-Jun-1946, MRN [id number]  PCP:  Perrin Maltese, MD  Cardiologist:  Neoma Laming, MD      History of Present Illness: Sheri Bennett is a 77 y.o. female who presents for  Chief Complaint  Patient presents with   Follow-up    ECHO Results    Patient in office for 1 month follow up. Patient did not do echo. Breathing better and less palpitations on increased dose of metoprolol.   Palpitations  This is a chronic problem. The current episode started more than 1 year ago. The problem occurs intermittently. The problem has been waxing and waning. Associated symptoms include shortness of breath. She has tried beta blockers for the symptoms. The treatment provided significant relief. Risk factors include obesity, dyslipidemia, diabetes mellitus and post menopause. Her past medical history is significant for heart disease.      Past Medical History:  Diagnosis Date   Diabetes mellitus    Hyperlipemia    Hypertension      Past Surgical History:  Procedure Laterality Date   TONSILLECTOMY       Current Outpatient Medications  Medication Sig Dispense Refill   Calcium Carbonate-Vitamin D (CALTRATE 600+D) 600-400 MG-UNIT per tablet Take 1 tablet by mouth daily.     celecoxib (CELEBREX) 200 MG capsule Take 200 mg by mouth daily.     Cholecalciferol (VITAMIN D-1000 MAX ST) 25 MCG (1000 UT) tablet Take 1,000 Units by mouth daily.     Cinnamon 500 MG TABS Take 500 mg by mouth daily.     fish oil-omega-3 fatty acids 1000 MG capsule Take 1 g by mouth daily.     losartan-hydrochlorothiazide (HYZAAR) 100-25 MG per tablet Take 1 tablet by mouth daily.     metoprolol tartrate (LOPRESSOR) 50 MG tablet Take 50 mg by mouth 2 (two) times daily.     potassium chloride (K-DUR) 10 MEQ tablet Take 10 mEq by mouth 2 (two) times daily.     RYBELSUS 3 MG TABS Take by mouth.     atorvastatin (LIPITOR) 80 MG tablet Take  80 mg by mouth daily.     clopidogrel (PLAVIX) 75 MG tablet Take 75 mg by mouth daily.     glimepiride (AMARYL) 2 MG tablet Take 2 mg by mouth 2 (two) times daily.     JARDIANCE 25 MG TABS tablet Take 25 mg by mouth daily.     levothyroxine (SYNTHROID, LEVOTHROID) 137 MCG tablet Take 137 mcg by mouth daily. (Patient not taking: Reported on 09/08/2022)     loratadine (CLARITIN) 10 MG tablet Take 10 mg by mouth daily as needed. For allergies (Patient not taking: Reported on 09/08/2022)     LORazepam (ATIVAN) 1 MG tablet Take 1 mg by mouth daily as needed.     nabumetone (RELAFEN) 500 MG tablet Take 500 mg by mouth 2 (two) times daily.     pantoprazole (PROTONIX) 40 MG tablet Take 40 mg by mouth daily.     simvastatin (ZOCOR) 20 MG tablet Take 20 mg by mouth every evening. (Patient not taking: Reported on 09/08/2022)     verapamil (COVERA HS) 180 MG (CO) 24 hr tablet Take 180 mg by mouth daily. (Patient not taking: Reported on 09/08/2022)     vitamin E 400 UNIT capsule Take 400 Units by mouth daily. (Patient not taking: Reported on 09/08/2022)     No current facility-administered medications for this visit.  Allergies:   Benadryl [diphenhydramine] and Codeine    Social History:   reports that she has never smoked. She does not have any smokeless tobacco history on file. She reports that she does not drink alcohol and does not use drugs.   Family History:  family history is not on file.    ROS:     Review of Systems  Constitutional: Negative.   HENT: Negative.    Eyes: Negative.   Respiratory:  Positive for shortness of breath.   Cardiovascular:  Positive for palpitations.  Gastrointestinal: Negative.   Genitourinary: Negative.   Musculoskeletal: Negative.   Skin: Negative.   Neurological: Negative.   Endo/Heme/Allergies: Negative.   Psychiatric/Behavioral: Negative.    All other systems reviewed and are negative.     All other systems are reviewed and negative.    PHYSICAL  EXAM: VS:  BP (!) 152/68   Pulse 63   Ht '5\' 6"'$  (1.676 m)   Wt 195 lb (88.5 kg)   SpO2 98%   BMI 31.47 kg/m  , BMI Body mass index is 31.47 kg/m. Last weight:  Wt Readings from Last 3 Encounters:  09/08/22 195 lb (88.5 kg)  08/23/11 230 lb (104.3 kg)     Physical Exam Constitutional:      Appearance: Normal appearance.  Cardiovascular:     Rate and Rhythm: Normal rate and regular rhythm.     Heart sounds: Normal heart sounds.  Pulmonary:     Effort: Pulmonary effort is normal.     Breath sounds: Normal breath sounds.  Musculoskeletal:     Right lower leg: No edema.     Left lower leg: No edema.  Neurological:     Mental Status: She is alert.     EKG: none today  Recent Labs: No results found for requested labs within last 365 days.    Lipid Panel No results found for: "CHOL", "TRIG", "HDL", "CHOLHDL", "VLDL", "LDLCALC", "LDLDIRECT"    Other studies Reviewed: Patient: 44.0 - Kimbria A. Dietrich Pates DOB:  1945/07/24  Date:  04/01/2021 14:30 Provider: Neoma Laming MD Encounter: ECHO   Page 2 REASON FOR VISIT  Visit for: Echocardiogram/Essential primary hypertension I10  Sex:   Female   wt= 197   lbs.  BP= 134/78  Height= 66   inches.   TESTS  Imaging: Echocardiogram:  An echocardiogram in (2-d) mode was performed and in Doppler mode with color flow velocity mapping was performed. The aortic valve cusps are abnormal 1.7  cm, flow velocity .993   m/s, and systolic calculated mean flow gradient 1  mmHg. Mitral valve diastolic peak flow velocity E .632      m/s and E/A ratio 1.1. Aortic root diameter 3.5    cm. The LVOT internal diameter 2.5  cm and flow velocity was abnormal .737   m/s. LV systolic dimension 99991111   cm, diastolic A999333 cm, posterior wall thickness 1.32  cm, fractional shortening 32.3 %, and EF 61 %. IVS thickness 1.34   cm. LA dimension 3.2 cm. Mitral Valve has Mild Regurgitation. Tricuspid Valve has Trace Regurgitation.     ASSESSMENT   Technically adequate study.  Normal chamber sizes.  Normal left ventricular systolic function.  Mild left ventricular hypertrophy with GRADE 1 (relaxation abnormality) diastolic dysfunction.  Normal right ventricular systolic function.  Normal right ventricular diastolic function.  Normal left ventricular wall motion.  Normal right ventricular wall motion.  Trace tricuspid regurgitation.  Normal pulmonary artery pressure.  Mild mitral regurgitation.  Trivial  pericardial effusion.  Normal Left and Right atrium  Normal Left and Right ventricle  Normal Ao root and ascending Ao  No LVH.  .  THERAPY   Referring physician: Dionisio David  Sonographer: Neoma Laming.   Neoma Laming MD  Electronically signed by: Neoma Laming     Date: 04/02/2021 15:02  Patient: 44.0 - Sanayah A. Dietrich Pates DOB:  [birth year]  Date:  04/23/2020 07:45 Provider: Neoma Laming MD Encounter: [facility] TEST   Page 2 TESTS                                                                                          [facility] [address] [place], [place] [address] [phone] STUDY:  Gated Stress / Rest Myocardial Perfusion Imaging Tomographic (SPECT) Including attenuation correction Wall Motion, Left Ventricular Ejection Fraction By Gated Technique.Treadmill Stress Test. SEX: Female    WEIGHT: 206 lbs   HEIGHT: 66 in                 ARMS UP: YES/NO                                                                        REFERRING PHYSICIAN: Dr.Adir Schicker                                                                                                                                                                                                                       INDICATION FOR STUDY:  CAD  TECHNIQUE:  Approximately 20 minutes following the intravenous administration of 10.1 mCi of Tc-62mSestamibi after stress testing in a reclined supine position with arms above their head if able to do so, gated SPECT imaging of the heart was performed. After about a 2hr break, the patient was injected intravenously with 32.1 mCi of Tc-95mestamibi.  Approximately 45 minutes later in the same position as stress imaging SPECT rest imaging of the heart was performed.  STRESS BY:  ShNeoma LamingMD PROTOCOL:   BrDarnell Level                                                                                     MAX PRED HR: 147                     85%: 125               75%: 110                                                                                                                   RESTING BP: 124/66   RESTING HR: 76   PEAK BP: 126/70   PEAK HR: 130 (88%)                                                                    EXERCISE DURATION: 3:31                                             METS: 5.2     REASON FOR TEST TERMINATION: Target reached/1 min post injection  SYMPTOMS: None  [facility] TREADMILL SCORE:  -1                                      RISK: Moderate                                                                                                                                                                                                            EKG RESULTS: NSR. 81/min. Low voltage. Non specific ST/T changes. At peak exercise 29m ST depression in inferolateral leads.                                                             IMAGE QUALITY:   Good  PERFUSION/WALL MOTION FINDINGS: EF = 66%. Small mild reversible basal inferior, mid inferior, and apical inferior wall defects, normal wall motion.                                                                           IMPRESSION: Ischemia in the RCA territory with normal LVEF, advise CCTA.                                                                                                                                                                                                                                                                                         Neoma Laming, MD Stress Interpreting Physician / Nuclear Interpreting Physician         Neoma Laming MD  Electronically signed by: Neoma Laming     Date: 04/28/2020 09:46   ASSESSMENT AND PLAN:    ICD-10-CM   1. Palpitations  R00.2     2. Primary hypertension  I10     3. Coronary artery disease involving native coronary artery of native heart without angina pectoris  I25.10     4. Mixed hyperlipidemia  E78.2     5. Hypothyroidism, unspecified type  E03.9        Problem List Items Addressed This Visit       Cardiovascular and Mediastinum   Coronary artery disease involving native coronary artery of native heart without angina pectoris   Relevant Medications   atorvastatin (LIPITOR) 80 MG tablet   metoprolol tartrate (LOPRESSOR) 50 MG tablet   Primary hypertension   Relevant Medications   atorvastatin (LIPITOR) 80 MG tablet   metoprolol tartrate (LOPRESSOR) 50 MG tablet     Other   Mixed hyperlipidemia   Relevant Medications   atorvastatin (LIPITOR) 80 MG tablet   metoprolol tartrate (LOPRESSOR) 50 MG tablet   Palpitations - Primary    Palpitations improved  on metoprolol 50 mg. Continue this dose. Echo prior to next visit due to recent chemo treatment.       Other Visit Diagnoses      Hypothyroidism, unspecified type       Relevant Medications   metoprolol tartrate (LOPRESSOR) 50 MG tablet        Disposition:   Return in about 4 months (around 01/07/2023) for echo same day.    Total time spent: 30 minutes  Signed,  Neoma Laming, MD  09/08/2022 10:46 AM    [place]

## 2022-09-08 NOTE — Assessment & Plan Note (Signed)
Palpitations improved on metoprolol 50 mg. Continue this dose. Echo prior to next visit due to recent chemo treatment.

## 2022-09-15 ENCOUNTER — Encounter: Payer: Self-pay | Admitting: Internal Medicine

## 2022-09-15 ENCOUNTER — Ambulatory Visit (INDEPENDENT_AMBULATORY_CARE_PROVIDER_SITE_OTHER): Payer: Medicare HMO | Admitting: Internal Medicine

## 2022-09-15 VITALS — BP 130/72 | HR 69 | Ht 66.0 in | Wt 197.6 lb

## 2022-09-15 DIAGNOSIS — I10 Essential (primary) hypertension: Secondary | ICD-10-CM

## 2022-09-15 DIAGNOSIS — E119 Type 2 diabetes mellitus without complications: Secondary | ICD-10-CM | POA: Diagnosis not present

## 2022-09-15 DIAGNOSIS — M1711 Unilateral primary osteoarthritis, right knee: Secondary | ICD-10-CM

## 2022-09-15 DIAGNOSIS — Z853 Personal history of malignant neoplasm of breast: Secondary | ICD-10-CM

## 2022-09-15 DIAGNOSIS — E782 Mixed hyperlipidemia: Secondary | ICD-10-CM

## 2022-09-15 LAB — POCT CBG (FASTING - GLUCOSE)-MANUAL ENTRY: Glucose Fasting, POC: 144 mg/dL — AB (ref 70–99)

## 2022-09-15 MED ORDER — CELECOXIB 200 MG PO CAPS: 200.0000 mg | ORAL_CAPSULE | Freq: Every day | ORAL | 3 refills | Status: AC

## 2022-09-15 NOTE — Progress Notes (Signed)
Established Patient Office Visit  Subjective:  Patient ID: Sheri Bennett, female    DOB: [birth year]  Age: 77 y.o. MRN: [id number]  Chief Complaint  Patient presents with   Follow-up    3 month follow up    Patient comes in for her follow-up today.  She is actually feeling good.  She recently had a steroid injection in her right knee and pain has have improved.  She is wearing the knee brace and has been instructed to do exercises at home..  Patient is fasting for blood work.  She has not started her Rybelsus yet but will be starting today at 3 mg.  Patient advised to increase to 2 tablets after 1 month is up.  Will see her in 2 months to evaluate weight loss.   Patient will come back fasting for blood work.     Past Medical History:  Diagnosis Date   CAD S/P percutaneous coronary angioplasty    Diabetes mellitus    Hyperlipemia    Hypertension    Malignant neoplasm of right breast [facility])     Past Surgical History:  Procedure Laterality Date   TONSILLECTOMY      Social History   Socioeconomic History   Marital status: Widowed    Spouse name: Not on file   Number of children: Not on file   Years of education: Not on file   Highest education level: Not on file  Occupational History   Not on file  Tobacco Use   Smoking status: Never   Smokeless tobacco: Not on file  Substance and Sexual Activity   Alcohol use: No   Drug use: No   Sexual activity: Never  Other Topics Concern   Not on file  Social History Narrative   Not on file   Social Determinants of Health   Financial Resource Strain: Not on file  Food Insecurity: Not on file  Transportation Needs: Not on file  Physical Activity: Not on file  Stress: Not on file  Social Connections: Not on file  Intimate Partner Violence: Not on file    History reviewed. No pertinent family history.  Allergies  Allergen Reactions   Benadryl [Diphenhydramine]    Codeine     Review of Systems   Constitutional: Negative.   HENT: Negative.    Eyes: Negative.   Respiratory: Negative.    Cardiovascular: Negative.   Gastrointestinal: Negative.   Genitourinary: Negative.   Musculoskeletal:  Positive for joint pain. Negative for back pain, falls, myalgias and neck pain.  Skin: Negative.   Neurological: Negative.   Psychiatric/Behavioral: Negative.         Objective:   BP 130/72   Pulse 69   Ht '5\' 6"'$  (1.676 m)   Wt 197 lb 9.6 oz (89.6 kg)   SpO2 97%   BMI 31.89 kg/m   Vitals:   09/15/22 1014  BP: 130/72  Pulse: 69  Height: '5\' 6"'$  (1.676 m)  Weight: 197 lb 9.6 oz (89.6 kg)  SpO2: 97%  BMI (Calculated): 31.91    Physical Exam Vitals and nursing note reviewed.  Constitutional:      Appearance: Normal appearance. She is obese.  HENT:     Head: Normocephalic.  Cardiovascular:     Rate and Rhythm: Normal rate and regular rhythm.     Pulses: Normal pulses.     Heart sounds: Normal heart sounds.  Pulmonary:     Effort: Pulmonary effort is normal.     Breath sounds: Normal  breath sounds.  Abdominal:     General: Abdomen is flat. Bowel sounds are normal.     Palpations: Abdomen is soft.  Musculoskeletal:        General: Swelling (Right knee) present.     Cervical back: Normal range of motion and neck supple.  Neurological:     General: No focal deficit present.     Mental Status: She is alert and oriented to person, place, and time.      Results for orders placed or performed in visit on 09/15/22  POCT CBG (Fasting - Glucose)  Result Value Ref Range   Glucose Fasting, POC 144 (A) 70 - 99 mg/dL    Recent Results (from the past 2160 hour(s))  POCT CBG (Fasting - Glucose)     Status: Abnormal   Collection Time: 09/15/22 10:19 AM  Result Value Ref Range   Glucose Fasting, POC 144 (A) 70 - 99 mg/dL      Assessment & Plan:  Patient to start Rybelsus today continue strict diet control and exercise as tolerated.  Fasting labs in few weeks. Problem List  Items Addressed This Visit     History of breast cancer (Chronic)   Type 2 diabetes mellitus without complications ([place]) - Primary (Chronic)   Relevant Orders   POCT CBG (Fasting - Glucose) (Completed)   Mixed hyperlipidemia   Other Visit Diagnoses     Primary osteoarthritis of right knee       Relevant Medications   celecoxib (CELEBREX) 200 MG capsule   Essential hypertension, benign           Return in about 2 months (around 11/15/2022).   Total time spent: 30 minutes  Perrin Maltese, MD  09/15/2022

## 2022-09-21 ENCOUNTER — Telehealth: Payer: Self-pay

## 2022-09-21 NOTE — Telephone Encounter (Signed)
Pt called and wanted me to ask you, at appt it was discussed regarding rx rybelsus take one tablet daily for first month then up to 2 tabs daily? She just wanted me to clarify that & she also asked should she take the Jardiance and rybelsus together or one in the morning & one in the evening? Please advise

## 2022-09-26 DIAGNOSIS — B351 Tinea unguium: Secondary | ICD-10-CM | POA: Diagnosis not present

## 2022-10-03 DIAGNOSIS — Z170 Estrogen receptor positive status [ER+]: Secondary | ICD-10-CM | POA: Diagnosis not present

## 2022-10-03 DIAGNOSIS — C50311 Malignant neoplasm of lower-inner quadrant of right female breast: Secondary | ICD-10-CM | POA: Diagnosis not present

## 2022-10-26 ENCOUNTER — Telehealth: Payer: Self-pay | Admitting: Internal Medicine

## 2022-10-26 NOTE — Telephone Encounter (Signed)
Patient left VM requesting call back.

## 2022-10-27 ENCOUNTER — Other Ambulatory Visit: Payer: Self-pay | Admitting: Internal Medicine

## 2022-10-27 DIAGNOSIS — B3731 Acute candidiasis of vulva and vagina: Secondary | ICD-10-CM

## 2022-10-27 MED ORDER — FLUCONAZOLE 150 MG PO TABS: 150.0000 mg | ORAL_TABLET | Freq: Every day | ORAL | 0 refills | Status: AC

## 2022-10-27 NOTE — Telephone Encounter (Signed)
Patient notified

## 2022-11-15 ENCOUNTER — Encounter: Payer: Self-pay | Admitting: Internal Medicine

## 2022-11-15 ENCOUNTER — Ambulatory Visit (INDEPENDENT_AMBULATORY_CARE_PROVIDER_SITE_OTHER): Payer: Medicare HMO | Admitting: Internal Medicine

## 2022-11-15 VITALS — BP 124/80 | HR 70 | Ht 66.0 in | Wt 198.8 lb

## 2022-11-15 DIAGNOSIS — E1165 Type 2 diabetes mellitus with hyperglycemia: Secondary | ICD-10-CM

## 2022-11-15 DIAGNOSIS — E782 Mixed hyperlipidemia: Secondary | ICD-10-CM | POA: Diagnosis not present

## 2022-11-15 DIAGNOSIS — E039 Hypothyroidism, unspecified: Secondary | ICD-10-CM | POA: Diagnosis not present

## 2022-11-15 DIAGNOSIS — I10 Essential (primary) hypertension: Secondary | ICD-10-CM

## 2022-11-15 DIAGNOSIS — I2510 Atherosclerotic heart disease of native coronary artery without angina pectoris: Secondary | ICD-10-CM

## 2022-11-15 LAB — POCT CBG (FASTING - GLUCOSE)-MANUAL ENTRY: Glucose Fasting, POC: 96 mg/dL (ref 70–99)

## 2022-11-15 NOTE — Progress Notes (Signed)
Established Patient Office Visit  Subjective:  Patient ID: Sheri Bennett, female    DOB: 29-Jun-1946  Age: 77 y.o. MRN: [id number]  Chief Complaint  Patient presents with   Follow-up    2 month follow up    Patient comes in for follow-up today.  She complains of bilateral watering of the eyes and some gritty sensation.  She was advised to start using artificial tears for eye dryness and has an appointment to see the eye doctor tomorrow.  She does not have any photophobia,  no pain in her eyes, and no visual blurring. Patient is taking all her medications regularly including Rybelsus.  She has 3 mg samples which she is taking 1 a day.  She notes that her blood sugars have improved on 3 mg and would like to just continue taking that.  No complaints of nausea, vomiting or constipation.  But she does feel that her appetite has reduced and her portion sizes are much smaller than before.  However she has not lost any weight at this time. She is due for blood work and we will see how her blood sugar control has improved. Patient reports that she is feeling more tired since her metoprolol dose was increased to 50 mg twice a day.  Would like to cut back down to 1 tablet daily.  Patient advised to do that but to keep her follow-up appointment with cardiology next month.    No other concerns at this time.   Past Medical History:  Diagnosis Date   CAD S/P percutaneous coronary angioplasty    Diabetes mellitus    Hyperlipemia    Hypertension    Malignant neoplasm of right breast [facility])     Past Surgical History:  Procedure Laterality Date   TONSILLECTOMY      Social History   Socioeconomic History   Marital status: Widowed    Spouse name: Not on file   Number of children: Not on file   Years of education: Not on file   Highest education level: Not on file  Occupational History   Not on file  Tobacco Use   Smoking status: Never   Smokeless tobacco: Not on file  Substance and  Sexual Activity   Alcohol use: No   Drug use: No   Sexual activity: Never  Other Topics Concern   Not on file  Social History Narrative   Not on file   Social Determinants of Health   Financial Resource Strain: Not on file  Food Insecurity: Not on file  Transportation Needs: Not on file  Physical Activity: Not on file  Stress: Not on file  Social Connections: Not on file  Intimate Partner Violence: Not on file    History reviewed. No pertinent family history.  Allergies  Allergen Reactions   Benadryl [Diphenhydramine]    Codeine     Review of Systems  Constitutional:  Negative for chills, diaphoresis, fever, malaise/fatigue and weight loss.  HENT:  Negative for congestion, ear discharge, hearing loss, sinus pain and sore throat.   Eyes:  Positive for discharge (watery). Negative for blurred vision, double vision, photophobia, pain and redness.  Respiratory:  Negative for cough, shortness of breath and wheezing.   Cardiovascular:  Negative for chest pain, palpitations, orthopnea, leg swelling and PND.  Gastrointestinal:  Negative for abdominal pain, constipation, diarrhea, heartburn, nausea and vomiting.  Genitourinary: Negative.   Musculoskeletal: Negative.   Neurological:  Negative for dizziness, tingling and sensory change.  Psychiatric/Behavioral:  Negative  for depression, hallucinations, substance abuse and suicidal ideas. The patient is not nervous/anxious and does not have insomnia.        Objective:   BP 124/80   Pulse 70   Ht 5\' 6"  (1.676 m)   Wt 198 lb 12.8 oz (90.2 kg)   SpO2 98%   BMI 32.09 kg/m   Vitals:   11/15/22 1044  BP: 124/80  Pulse: 70  Height: 5\' 6"  (1.676 m)  Weight: 198 lb 12.8 oz (90.2 kg)  SpO2: 98%  BMI (Calculated): 32.1    Physical Exam Vitals and nursing note reviewed.  Constitutional:      Appearance: Normal appearance.  HENT:     Nose: Nose normal.  Eyes:     Conjunctiva/sclera: Conjunctivae normal.  Cardiovascular:      Rate and Rhythm: Normal rate and regular rhythm.     Pulses: Normal pulses.     Heart sounds: Normal heart sounds.  Pulmonary:     Effort: Pulmonary effort is normal.     Breath sounds: Normal breath sounds. No wheezing, rhonchi or rales.  Abdominal:     General: Bowel sounds are normal.     Palpations: Abdomen is soft.  Musculoskeletal:        General: Normal range of motion.     Cervical back: Normal range of motion and neck supple.  Neurological:     General: No focal deficit present.     Mental Status: She is alert.  Psychiatric:        Mood and Affect: Mood normal.        Behavior: Behavior normal.      Results for orders placed or performed in visit on 11/15/22  POCT CBG (Fasting - Glucose)  Result Value Ref Range   Glucose Fasting, POC 96 70 - 99 mg/dL    Recent Results (from the past 2160 hour(s))  POCT CBG (Fasting - Glucose)     Status: Abnormal   Collection Time: 09/15/22 10:19 AM  Result Value Ref Range   Glucose Fasting, POC 144 (A) 70 - 99 mg/dL  POCT CBG (Fasting - Glucose)     Status: Normal   Collection Time: 11/15/22 10:50 AM  Result Value Ref Range   Glucose Fasting, POC 96 70 - 99 mg/dL      Assessment & Plan:  Patient advised to continue taking all her medications as such.  But she is to reduce her metoprolol tablet 50 mg to once a day for now.  If she feels palpitations again then can increase back up to twice a day.  She will further discuss this with her cardiologist at follow-up. Problem List Items Addressed This Visit     Coronary artery disease involving native coronary artery of native heart without angina pectoris   Essential hypertension, benign   Relevant Orders   CBC With Differential   CMP14+EGFR   Type 2 diabetes mellitus with hyperglycemia, without long-term current use of insulin (HCC) - Primary   Relevant Orders   POCT CBG (Fasting - Glucose) (Completed)   Hemoglobin A1c   Mixed hyperlipidemia   Relevant Orders   Lipid  Panel w/o Chol/HDL Ratio   Hypothyroidism   Relevant Orders   TSH+T4F+T3Free    Return in about 3 months (around 02/15/2023).   Total time spent: 30 minutes  Sheri Loveless, MD  11/15/2022

## 2022-11-16 DIAGNOSIS — H5231 Anisometropia: Secondary | ICD-10-CM | POA: Diagnosis not present

## 2022-11-16 DIAGNOSIS — E1165 Type 2 diabetes mellitus with hyperglycemia: Secondary | ICD-10-CM | POA: Diagnosis not present

## 2022-11-16 DIAGNOSIS — E1136 Type 2 diabetes mellitus with diabetic cataract: Secondary | ICD-10-CM | POA: Diagnosis not present

## 2022-11-16 DIAGNOSIS — H18513 Endothelial corneal dystrophy, bilateral: Secondary | ICD-10-CM | POA: Diagnosis not present

## 2022-11-16 DIAGNOSIS — H25013 Cortical age-related cataract, bilateral: Secondary | ICD-10-CM | POA: Diagnosis not present

## 2022-11-16 DIAGNOSIS — H2513 Age-related nuclear cataract, bilateral: Secondary | ICD-10-CM | POA: Diagnosis not present

## 2022-11-16 DIAGNOSIS — H04123 Dry eye syndrome of bilateral lacrimal glands: Secondary | ICD-10-CM | POA: Diagnosis not present

## 2022-11-16 LAB — CMP14+EGFR
ALT: 16 IU/L (ref 0–32)
AST: 16 IU/L (ref 0–40)
Albumin/Globulin Ratio: 2.1 (ref 1.2–2.2)
Albumin: 4.6 g/dL (ref 3.8–4.8)
Alkaline Phosphatase: 123 IU/L — ABNORMAL HIGH (ref 44–121)
BUN/Creatinine Ratio: 11 — ABNORMAL LOW (ref 12–28)
BUN: 11 mg/dL (ref 8–27)
Bilirubin Total: 0.5 mg/dL (ref 0.0–1.2)
CO2: 26 mmol/L (ref 20–29)
Calcium: 9.7 mg/dL (ref 8.7–10.3)
Chloride: 97 mmol/L (ref 96–106)
Creatinine, Ser: 0.97 mg/dL (ref 0.57–1.00)
Globulin, Total: 2.2 g/dL (ref 1.5–4.5)
Glucose: 88 mg/dL (ref 70–99)
Potassium: 3.4 mmol/L — ABNORMAL LOW (ref 3.5–5.2)
Sodium: 140 mmol/L (ref 134–144)
Total Protein: 6.8 g/dL (ref 6.0–8.5)
eGFR: 61 mL/min/{1.73_m2} (ref >59)

## 2022-11-16 LAB — LIPID PANEL W/O CHOL/HDL RATIO
Cholesterol, Total: 174 mg/dL (ref 100–199)
HDL: 58 mg/dL (ref >39)
LDL Chol Calc (NIH): 96 mg/dL (ref 0–99)
Triglycerides: 115 mg/dL (ref 0–149)
VLDL Cholesterol Cal: 20 mg/dL (ref 5–40)

## 2022-11-16 LAB — CBC WITH DIFFERENTIAL
Basophils Absolute: 0 10*3/uL (ref 0.0–0.2)
Basos: 0 %
EOS (ABSOLUTE): 0 10*3/uL (ref 0.0–0.4)
Eos: 0 %
Hematocrit: 44.3 % (ref 34.0–46.6)
Hemoglobin: 13.8 g/dL (ref 11.1–15.9)
Immature Grans (Abs): 0 10*3/uL (ref 0.0–0.1)
Immature Granulocytes: 0 %
Lymphocytes Absolute: 1.3 10*3/uL (ref 0.7–3.1)
Lymphs: 29 %
MCH: 26.1 pg — ABNORMAL LOW (ref 26.6–33.0)
MCHC: 31.2 g/dL — ABNORMAL LOW (ref 31.5–35.7)
MCV: 84 fL (ref 79–97)
Monocytes Absolute: 0.5 10*3/uL (ref 0.1–0.9)
Monocytes: 11 %
Neutrophils Absolute: 2.8 10*3/uL (ref 1.4–7.0)
Neutrophils: 60 %
RBC: 5.28 x10E6/uL (ref 3.77–5.28)
RDW: 14.9 % (ref 11.7–15.4)
WBC: 4.6 10*3/uL (ref 3.4–10.8)

## 2022-11-16 LAB — HEMOGLOBIN A1C
Est. average glucose Bld gHb Est-mCnc: 154 mg/dL
Hgb A1c MFr Bld: 7 % — ABNORMAL HIGH (ref 4.8–5.6)

## 2022-11-16 LAB — TSH+T4F+T3FREE
Free T4: 1.22 ng/dL (ref 0.82–1.77)
T3, Free: 2.2 pg/mL (ref 2.0–4.4)
TSH: 10.6 u[IU]/mL — ABNORMAL HIGH (ref 0.450–4.500)

## 2022-11-21 ENCOUNTER — Other Ambulatory Visit: Payer: Self-pay

## 2022-11-21 MED ORDER — LEVOTHYROXINE SODIUM 125 MCG PO TABS: 125.0000 ug | ORAL_TABLET | Freq: Every day | ORAL | 6 refills | Status: DC

## 2022-11-21 MED ORDER — POTASSIUM CHLORIDE ER 10 MEQ PO TBCR: EXTENDED_RELEASE_TABLET | ORAL | 3 refills | Status: DC

## 2022-11-22 IMAGING — CR DG HIP (WITH OR WITHOUT PELVIS) 2-3V*L*
1 series · 3 of 3 positions shown · non-contrast
Comparison: None Available.

CLINICAL DATA: Hip pain.  Left-sided hip pain for 3 weeks.

EXAM:
DG HIP (WITH OR WITHOUT PELVIS) 2-3V LEFT

[Series 1: dg hip unilat w or w/o pelvis 2-3 views  · non-contrast · 0.14mm/px · 3 of 3 slices shown]
[im 1/3]
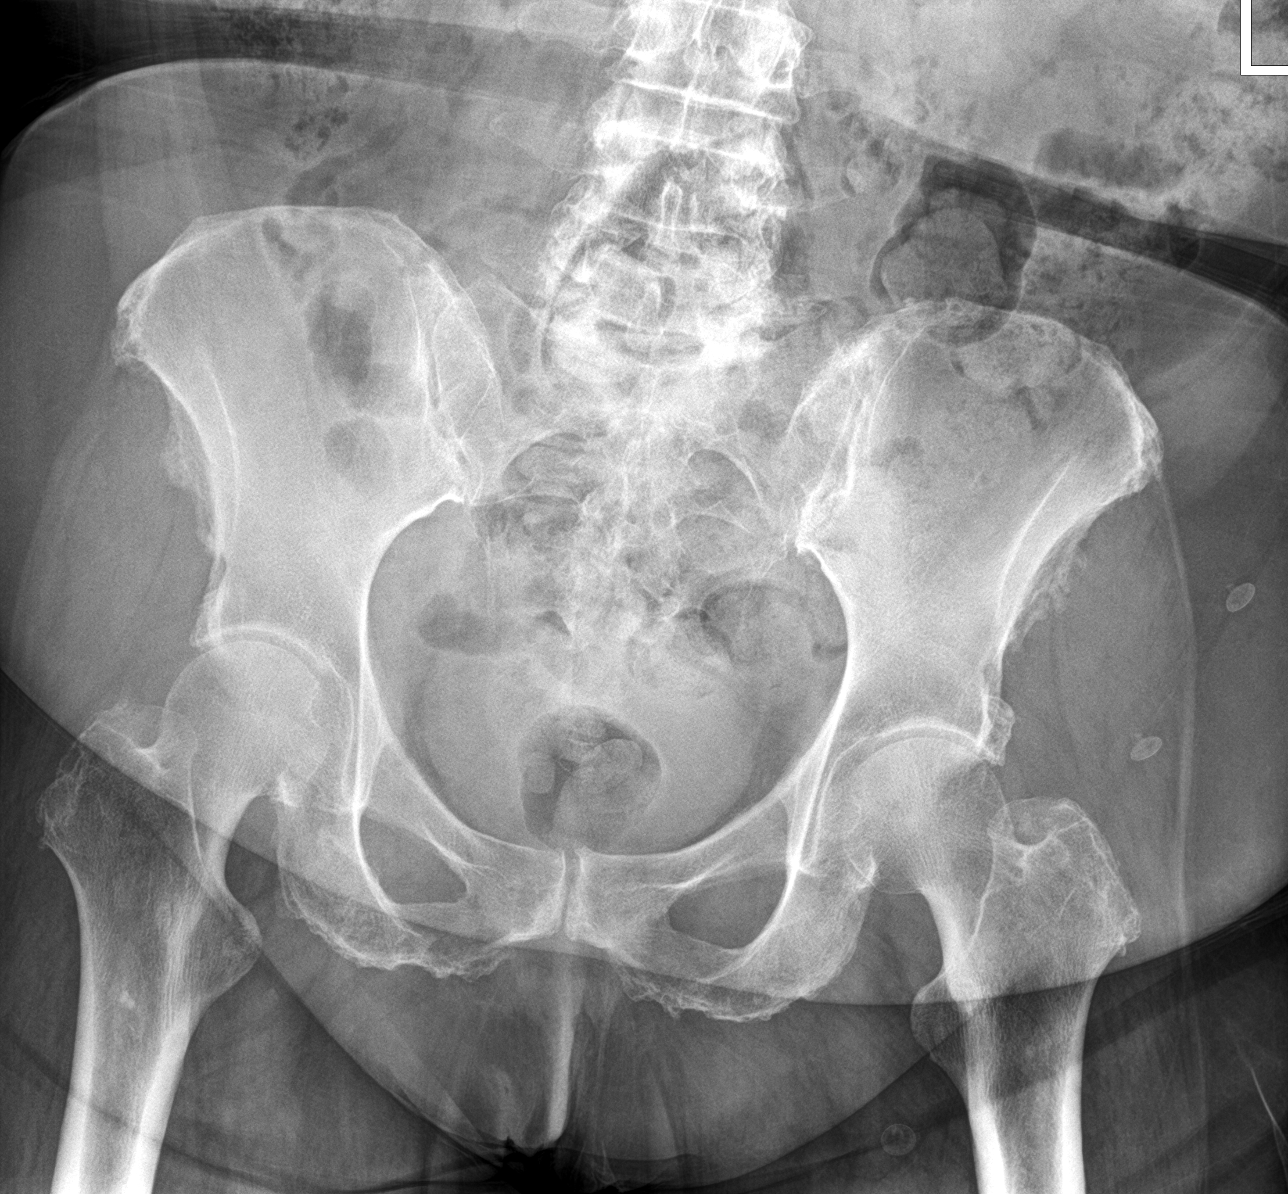
[im 2/3]
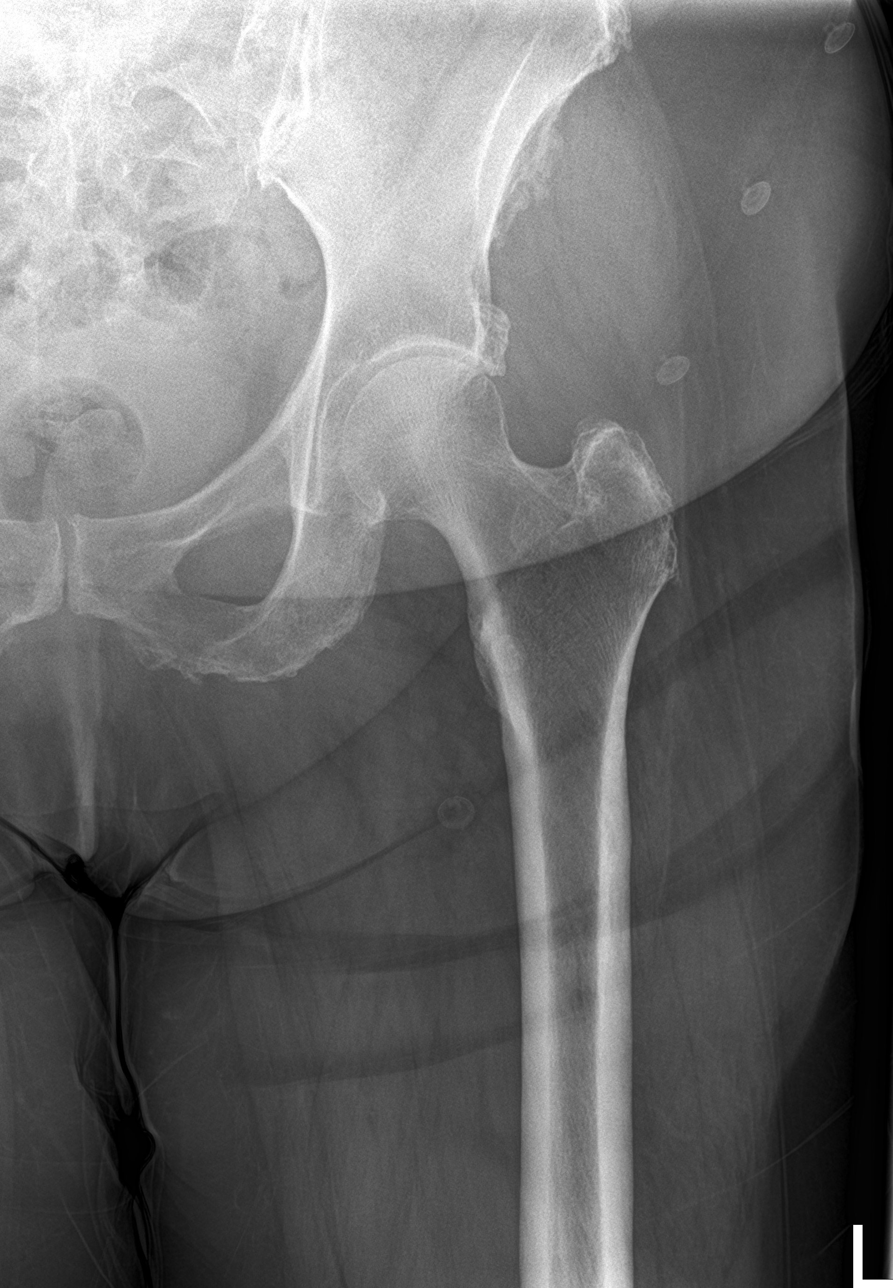
[im 3/3]
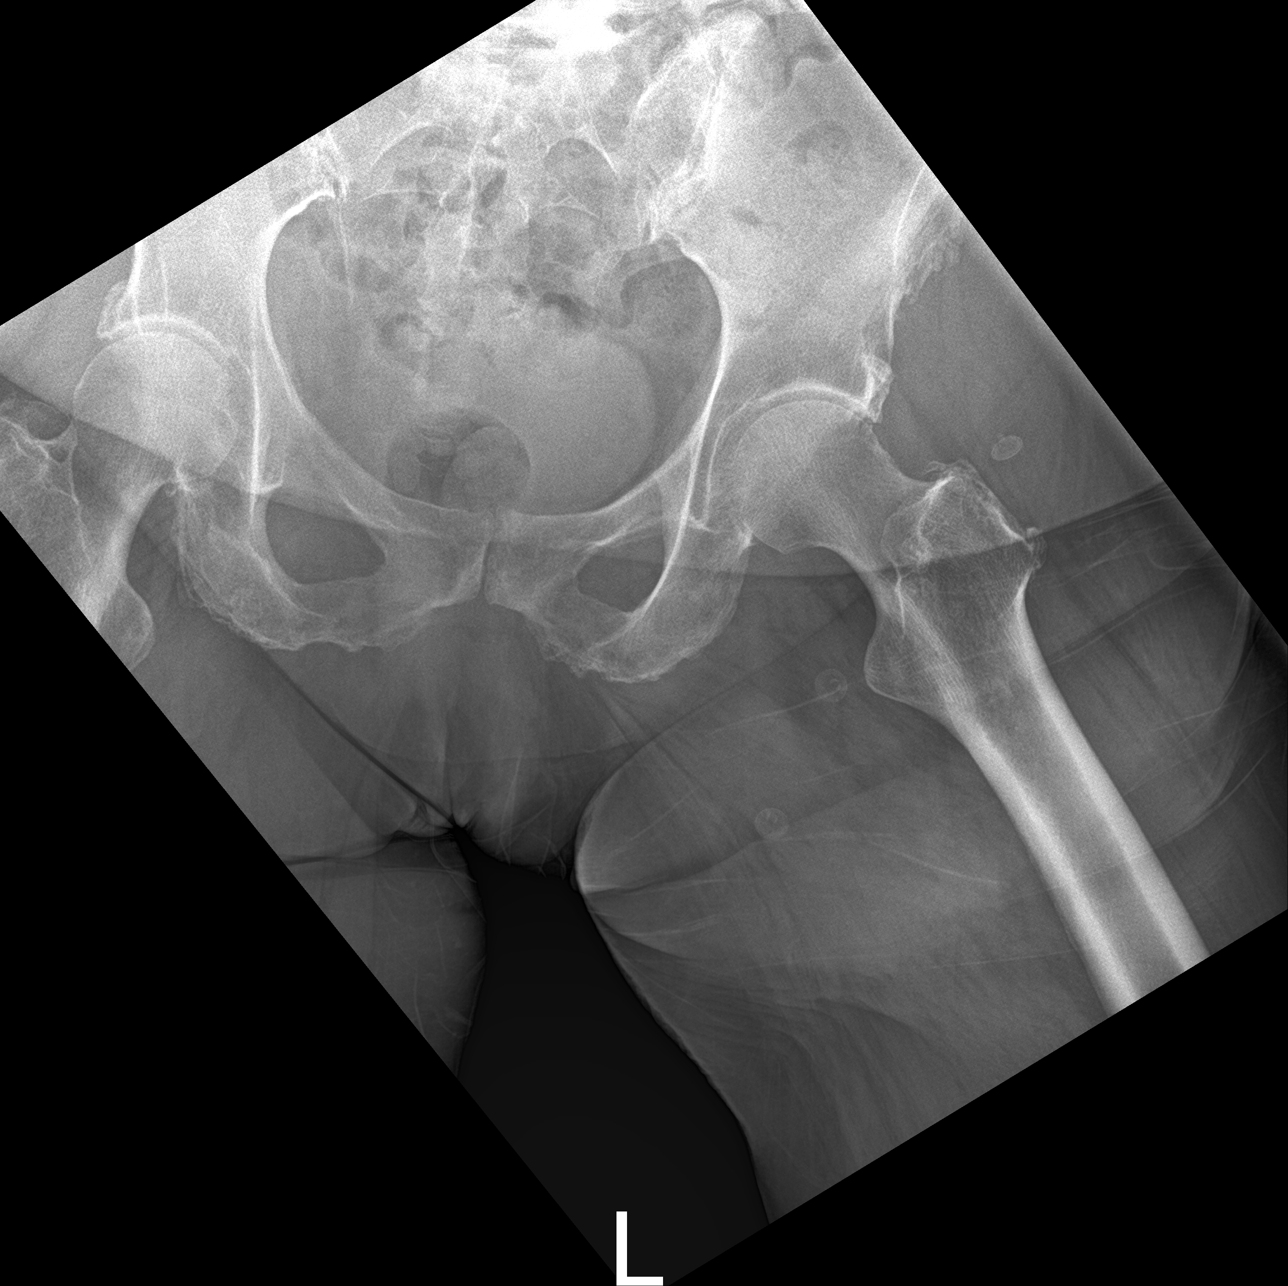

[3 of 3 positions shown; findings below may reference images not displayed]

FINDINGS: Moderate pubic symphysis joint space narrowing, subchondral
sclerosis and mild peripheral osteophytosis.

Mild left-greater-than-right sacroiliac subchondral sclerosis
degenerative change.

Moderate bilateral femoroacetabular joint space narrowing. Moderate
superolateral left acetabular degenerative osteophytes. Minimal
anterior superior left femoral head-neck junction degenerative
osteophytosis seen on lateral view.

No acute fracture or dislocation.
IMPRESSION: Moderate left-greater-than-right femoroacetabular and moderate pubic
symphysis osteoarthritis.

## 2022-12-08 ENCOUNTER — Telehealth: Payer: Self-pay

## 2022-12-08 NOTE — Telephone Encounter (Signed)
Pt called regarding from appt a few weeks ago it was discussed about pt getting CGM & she wasn't interested- but said she does want to try now. I can send an order so pt can get supplies for free to [facility], she wanted me to see if you want her to use Dexcom or Freestyle libre?   Also asked if you can send in rx Xiidra eye drops for dry eyes? 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

## 2022-12-09 ENCOUNTER — Other Ambulatory Visit: Payer: Self-pay

## 2022-12-09 MED ORDER — XIIDRA 5 % OP SOLN: OPHTHALMIC | 6 refills | Status: AC

## 2022-12-09 NOTE — Telephone Encounter (Signed)
Rx sent, orders sent to [facility]

## 2022-12-12 DIAGNOSIS — Z1330 Encounter for screening examination for mental health and behavioral disorders, unspecified: Secondary | ICD-10-CM | POA: Diagnosis not present

## 2022-12-12 DIAGNOSIS — E1165 Type 2 diabetes mellitus with hyperglycemia: Secondary | ICD-10-CM | POA: Diagnosis not present

## 2022-12-12 DIAGNOSIS — M25561 Pain in right knee: Secondary | ICD-10-CM | POA: Diagnosis not present

## 2022-12-12 DIAGNOSIS — M1711 Unilateral primary osteoarthritis, right knee: Secondary | ICD-10-CM | POA: Diagnosis not present

## 2022-12-15 ENCOUNTER — Other Ambulatory Visit: Payer: Self-pay

## 2022-12-15 MED ORDER — LEVOTHYROXINE SODIUM 125 MCG PO TABS: 125.0000 ug | ORAL_TABLET | Freq: Every day | ORAL | 6 refills | Status: DC

## 2022-12-22 ENCOUNTER — Telehealth: Payer: Self-pay | Admitting: Internal Medicine

## 2022-12-22 ENCOUNTER — Other Ambulatory Visit: Payer: Self-pay | Admitting: Internal Medicine

## 2022-12-22 DIAGNOSIS — E1165 Type 2 diabetes mellitus with hyperglycemia: Secondary | ICD-10-CM

## 2022-12-22 MED ORDER — DEXCOM G6 RECEIVER DEVI
1.0000 | Freq: Once | 0 refills | Status: AC
Start: 2022-12-22 — End: 2022-12-22

## 2022-12-22 MED ORDER — DEXCOM G6 SENSOR MISC
1.0000 | Freq: Once | 12 refills | Status: AC
Start: 2022-12-22 — End: 2022-12-22

## 2022-12-22 MED ORDER — DEXCOM G6 TRANSMITTER MISC
1.0000 | Freq: Once | 3 refills | Status: AC
Start: 2022-12-22 — End: 2022-12-22

## 2022-12-22 NOTE — Telephone Encounter (Signed)
Patient left VM requesting call back.

## 2022-12-23 NOTE — Telephone Encounter (Signed)
Patient left VM wanting a call back about her Rybelsus.

## 2022-12-26 ENCOUNTER — Other Ambulatory Visit: Payer: Self-pay | Admitting: Internal Medicine

## 2022-12-26 DIAGNOSIS — E1165 Type 2 diabetes mellitus with hyperglycemia: Secondary | ICD-10-CM

## 2022-12-26 MED ORDER — RYBELSUS 3 MG PO TABS
3.0000 | ORAL_TABLET | Freq: Every day | ORAL | 3 refills | Status: AC
Start: 2022-12-26 — End: ?

## 2022-12-29 ENCOUNTER — Other Ambulatory Visit: Payer: Self-pay | Admitting: Family

## 2022-12-30 ENCOUNTER — Telehealth: Payer: Self-pay

## 2022-12-30 NOTE — Telephone Encounter (Signed)
Patient called and LM asking for you to call her back

## 2023-01-04 ENCOUNTER — Ambulatory Visit (INDEPENDENT_AMBULATORY_CARE_PROVIDER_SITE_OTHER): Payer: Medicare HMO

## 2023-01-04 DIAGNOSIS — R0602 Shortness of breath: Secondary | ICD-10-CM

## 2023-01-04 DIAGNOSIS — I340 Nonrheumatic mitral (valve) insufficiency: Secondary | ICD-10-CM

## 2023-01-04 DIAGNOSIS — R002 Palpitations: Secondary | ICD-10-CM

## 2023-01-09 ENCOUNTER — Other Ambulatory Visit: Payer: Medicare HMO

## 2023-01-09 ENCOUNTER — Ambulatory Visit: Payer: Medicare HMO | Admitting: Cardiovascular Disease

## 2023-01-16 ENCOUNTER — Telehealth: Payer: Self-pay | Admitting: Internal Medicine

## 2023-01-16 NOTE — Telephone Encounter (Signed)
Patient left VM that she needs diflucan and Rybelsus. We have been giving her samples of Rybelsus but we are currently out upstairs.

## 2023-01-17 ENCOUNTER — Other Ambulatory Visit: Payer: Self-pay | Admitting: Internal Medicine

## 2023-01-17 DIAGNOSIS — B3731 Acute candidiasis of vulva and vagina: Secondary | ICD-10-CM

## 2023-01-17 MED ORDER — FLUCONAZOLE 150 MG PO TABS
150.0000 mg | ORAL_TABLET | Freq: Every day | ORAL | 1 refills | Status: DC
Start: 2023-01-17 — End: 2023-04-24

## 2023-01-19 ENCOUNTER — Other Ambulatory Visit: Payer: Self-pay | Admitting: Internal Medicine

## 2023-01-19 ENCOUNTER — Telehealth: Payer: Self-pay | Admitting: Internal Medicine

## 2023-01-19 DIAGNOSIS — G8929 Other chronic pain: Secondary | ICD-10-CM | POA: Diagnosis not present

## 2023-01-19 DIAGNOSIS — E1165 Type 2 diabetes mellitus with hyperglycemia: Secondary | ICD-10-CM

## 2023-01-19 DIAGNOSIS — M25561 Pain in right knee: Secondary | ICD-10-CM | POA: Diagnosis not present

## 2023-01-19 DIAGNOSIS — M1711 Unilateral primary osteoarthritis, right knee: Secondary | ICD-10-CM | POA: Diagnosis not present

## 2023-01-19 MED ORDER — RYBELSUS 7 MG PO TABS
1.0000 | ORAL_TABLET | Freq: Every day | ORAL | 3 refills | Status: DC
Start: 2023-01-19 — End: 2024-05-27

## 2023-01-19 NOTE — Telephone Encounter (Signed)
I received a fax from [facility] that they will cover the Rybelsus 3 mg at 30 tabs for 30 days or 90 tabs for 90 days. It looks like the rx that you sent to pharmacy was for 3 tabs a day. Is that how you would like her taking it? If it should be 1 tab daily then they will cover it without a PA. Please advise.

## 2023-02-06 ENCOUNTER — Telehealth: Payer: Self-pay

## 2023-02-06 ENCOUNTER — Other Ambulatory Visit: Payer: Self-pay

## 2023-02-06 MED ORDER — PAXLOVID (300/100) 20 X 150 MG & 10 X 100MG PO TBPK: 3.0000 | ORAL_TABLET | Freq: Two times a day (BID) | ORAL | 0 refills | Status: AC

## 2023-02-06 NOTE — Telephone Encounter (Signed)
Patient called stating she just got back from her cruise and she has covid and she would like for Leah to call her back

## 2023-02-14 ENCOUNTER — Telehealth: Payer: Self-pay | Admitting: Internal Medicine

## 2023-02-14 NOTE — Telephone Encounter (Signed)
Patient left VM for me to call her back about her Rybelsus. I am getting everything in line for patient assistance before I call her back.

## 2023-02-16 ENCOUNTER — Ambulatory Visit: Payer: Medicare HMO | Admitting: Internal Medicine

## 2023-02-21 DIAGNOSIS — B351 Tinea unguium: Secondary | ICD-10-CM | POA: Diagnosis not present

## 2023-02-21 DIAGNOSIS — L603 Nail dystrophy: Secondary | ICD-10-CM | POA: Diagnosis not present

## 2023-02-23 ENCOUNTER — Encounter: Payer: Self-pay | Admitting: Internal Medicine

## 2023-02-23 ENCOUNTER — Ambulatory Visit: Payer: Medicare HMO | Admitting: Internal Medicine

## 2023-02-23 VITALS — BP 114/68 | HR 89 | Ht 66.0 in | Wt 197.4 lb

## 2023-02-23 DIAGNOSIS — I2510 Atherosclerotic heart disease of native coronary artery without angina pectoris: Secondary | ICD-10-CM | POA: Diagnosis not present

## 2023-02-23 DIAGNOSIS — E1165 Type 2 diabetes mellitus with hyperglycemia: Secondary | ICD-10-CM

## 2023-02-23 DIAGNOSIS — E538 Deficiency of other specified B group vitamins: Secondary | ICD-10-CM | POA: Diagnosis not present

## 2023-02-23 DIAGNOSIS — I10 Essential (primary) hypertension: Secondary | ICD-10-CM | POA: Diagnosis not present

## 2023-02-23 DIAGNOSIS — E782 Mixed hyperlipidemia: Secondary | ICD-10-CM

## 2023-02-23 DIAGNOSIS — E039 Hypothyroidism, unspecified: Secondary | ICD-10-CM

## 2023-02-23 LAB — POCT CBG (FASTING - GLUCOSE)-MANUAL ENTRY: Glucose Fasting, POC: 119 mg/dL — AB (ref 70–99)

## 2023-02-23 NOTE — Progress Notes (Signed)
Established Patient Office Visit  Subjective:  Patient ID: Sheri Bennett, female    DOB: 09-Sep-1945  Age: 77 y.o. MRN: [id number]  Chief Complaint  Patient presents with   Follow-up    3 month follow up    Patient comes in for her follow-up today.  She is feeling well and is fasting for blood work.  Currently she is taking Rybelsus 7 mg/day and is tolerating it well. Scheduled for cataract surgery next month.    No other concerns at this time.   Past Medical History:  Diagnosis Date   CAD S/P percutaneous coronary angioplasty    Diabetes mellitus    Hyperlipemia    Hypertension    Malignant neoplasm of right breast [facility])     Past Surgical History:  Procedure Laterality Date   TONSILLECTOMY      Social History   Socioeconomic History   Marital status: Widowed    Spouse name: Not on file   Number of children: Not on file   Years of education: Not on file   Highest education level: Not on file  Occupational History   Not on file  Tobacco Use   Smoking status: Never   Smokeless tobacco: Not on file  Substance and Sexual Activity   Alcohol use: No   Drug use: No   Sexual activity: Never  Other Topics Concern   Not on file  Social History Narrative   Not on file   Social Determinants of Health   Financial Resource Strain: Patient Declined (08/20/2022)   Received from [facility], [facility]   Overall Financial Resource Strain (CARDIA)    Difficulty of Paying Living Expenses: Patient declined  Food Insecurity: Patient Declined (08/20/2022)   Received from [facility], [facility]   Hunger Vital Sign    Worried About Running Out of Food in the Last Year: Patient declined    Ran Out of Food in the Last Year: Patient declined  Transportation Needs: Patient Declined (08/20/2022)   Received from [facility], [facility]   [facility] - Transportation    Lack of Transportation (Medical): Patient declined    Lack of Transportation (Non-Medical):  Patient declined  Physical Activity: Sufficiently Active (08/01/2021)   Received from [facility], [facility]   Exercise Vital Sign    Days of Exercise per Week: 3 days    Minutes of Exercise per Session: 120 min  Stress: No Stress Concern Present (08/05/2021)   Received from [facility], [facility]   [employer] of [facility]ational [facility] - Occupational Stress Questionnaire    Feeling of Stress : Only a little  Social Connections: Unknown (11/20/2021)   Received from [facility], [facility]   Social Network    Social Network: Not on file  Intimate Partner Violence: Unknown (10/11/2021)   Received from [facility], [facility]   HITS    Physically Hurt: Not on file    Insult or Talk Down To: Not on file    Threaten Physical Harm: Not on file    Scream or Curse: Not on file    History reviewed. No pertinent family history.  Allergies  Allergen Reactions   Benadryl [Diphenhydramine]    Codeine     Review of Systems  Constitutional: Negative.   HENT: Negative.    Eyes: Negative.   Respiratory: Negative.  Negative for cough and shortness of breath.   Cardiovascular: Negative.  Negative for chest pain, palpitations and leg swelling.  Gastrointestinal: Negative.  Negative for  abdominal pain, constipation, diarrhea, heartburn, nausea and vomiting.  Genitourinary: Negative.  Negative for dysuria and flank pain.  Musculoskeletal: Negative.  Negative for joint pain and myalgias.  Skin: Negative.   Neurological: Negative.  Negative for dizziness and headaches.  Endo/Heme/Allergies: Negative.   Psychiatric/Behavioral: Negative.  Negative for depression and suicidal ideas. The patient is not nervous/anxious.        Objective:   BP 114/68   Pulse 89   Ht 5\' 6"  (1.676 m)   Wt 197 lb 6.4 oz (89.5 kg)   SpO2 97%   BMI 31.86 kg/m   Vitals:   02/23/23 1025  BP: 114/68  Pulse: 89  Height: 5\' 6"  (1.676 m)  Weight: 197 lb 6.4 oz (89.5 kg)  SpO2: 97%  BMI  (Calculated): 31.88    Physical Exam Vitals and nursing note reviewed.  Constitutional:      Appearance: Normal appearance.  HENT:     Head: Normocephalic and atraumatic.     Nose: Nose normal.     Mouth/Throat:     Mouth: Mucous membranes are moist.     Pharynx: Oropharynx is clear.  Eyes:     Conjunctiva/sclera: Conjunctivae normal.     Pupils: Pupils are equal, round, and reactive to light.  Cardiovascular:     Rate and Rhythm: Normal rate and regular rhythm.     Pulses: Normal pulses.     Heart sounds: Normal heart sounds. No murmur heard. Pulmonary:     Effort: Pulmonary effort is normal.     Breath sounds: Normal breath sounds. No wheezing.  Abdominal:     General: Bowel sounds are normal.     Palpations: Abdomen is soft.     Tenderness: There is no abdominal tenderness. There is no right CVA tenderness or left CVA tenderness.  Musculoskeletal:        General: Normal range of motion.     Cervical back: Normal range of motion.     Right lower leg: No edema.     Left lower leg: No edema.  Skin:    General: Skin is warm and dry.  Neurological:     General: No focal deficit present.     Mental Status: She is alert and oriented to person, place, and time.  Psychiatric:        Mood and Affect: Mood normal.        Behavior: Behavior normal.      Results for orders placed or performed in visit on 02/23/23  POCT CBG (Fasting - Glucose)  Result Value Ref Range   Glucose Fasting, POC 119 (A) 70 - 99 mg/dL    Recent Results (from the past 2160 hour(s))  POCT CBG (Fasting - Glucose)     Status: Abnormal   Collection Time: 02/23/23 10:34 AM  Result Value Ref Range   Glucose Fasting, POC 119 (A) 70 - 99 mg/dL      Assessment & Plan:  Continue all medications.  Check labs today. Problem List Items Addressed This Visit     B12 deficiency   Relevant Orders   Vitamin B12   Coronary artery disease involving native coronary artery of native heart without angina  pectoris   Relevant Orders   CBC with Diff   Essential hypertension, benign   Relevant Orders   CMP14+EGFR   Type 2 diabetes mellitus with hyperglycemia, without long-term current use of insulin (HCC) - Primary   Relevant Orders   POCT CBG (Fasting - Glucose) (Completed)  Hemoglobin A1c   Mixed hyperlipidemia   Relevant Orders   Lipid Panel w/o Chol/HDL Ratio   Hypothyroidism   Relevant Orders   TSH+T4F+T3Free    Return in about 3 months (around 05/26/2023).   Total time spent: 30 minutes  [name], MD  02/23/2023   This document may have been prepared by [facility] Voice Recognition software and as such may include unintentional dictation errors.

## 2023-02-24 ENCOUNTER — Other Ambulatory Visit: Payer: Self-pay

## 2023-02-24 ENCOUNTER — Other Ambulatory Visit: Payer: Self-pay | Admitting: Internal Medicine

## 2023-02-24 DIAGNOSIS — E039 Hypothyroidism, unspecified: Secondary | ICD-10-CM

## 2023-02-24 LAB — CMP14+EGFR
ALT: 16 IU/L (ref 0–32)
AST: 16 IU/L (ref 0–40)
Albumin: 4.2 g/dL (ref 3.8–4.8)
Alkaline Phosphatase: 125 IU/L — ABNORMAL HIGH (ref 44–121)
BUN/Creatinine Ratio: 12 (ref 12–28)
BUN: 12 mg/dL (ref 8–27)
Bilirubin Total: 0.4 mg/dL (ref 0.0–1.2)
CO2: 27 mmol/L (ref 20–29)
Calcium: 9.4 mg/dL (ref 8.7–10.3)
Chloride: 103 mmol/L (ref 96–106)
Creatinine, Ser: 1.01 mg/dL — ABNORMAL HIGH (ref 0.57–1.00)
Globulin, Total: 2.4 g/dL (ref 1.5–4.5)
Glucose: 90 mg/dL (ref 70–99)
Potassium: 3.6 mmol/L (ref 3.5–5.2)
Sodium: 145 mmol/L — ABNORMAL HIGH (ref 134–144)
Total Protein: 6.6 g/dL (ref 6.0–8.5)
eGFR: 58 mL/min/{1.73_m2} — ABNORMAL LOW (ref >59)

## 2023-02-24 LAB — LIPID PANEL W/O CHOL/HDL RATIO
Cholesterol, Total: 185 mg/dL (ref 100–199)
HDL: 59 mg/dL (ref >39)
LDL Chol Calc (NIH): 105 mg/dL — ABNORMAL HIGH (ref 0–99)
Triglycerides: 117 mg/dL (ref 0–149)
VLDL Cholesterol Cal: 21 mg/dL (ref 5–40)

## 2023-02-24 LAB — TSH+T4F+T3FREE
Free T4: 1.16 ng/dL (ref 0.82–1.77)
T3, Free: 1.8 pg/mL — ABNORMAL LOW (ref 2.0–4.4)
TSH: 5.03 u[IU]/mL — ABNORMAL HIGH (ref 0.450–4.500)

## 2023-02-24 LAB — CBC WITH DIFFERENTIAL/PLATELET
Basophils Absolute: 0 10*3/uL (ref 0.0–0.2)
Basos: 0 %
EOS (ABSOLUTE): 0 10*3/uL (ref 0.0–0.4)
Eos: 0 %
Hematocrit: 41.9 % (ref 34.0–46.6)
Hemoglobin: 13.4 g/dL (ref 11.1–15.9)
Immature Grans (Abs): 0 10*3/uL (ref 0.0–0.1)
Immature Granulocytes: 1 %
Lymphocytes Absolute: 1 10*3/uL (ref 0.7–3.1)
Lymphs: 20 %
MCH: 26.4 pg — ABNORMAL LOW (ref 26.6–33.0)
MCHC: 32 g/dL (ref 31.5–35.7)
MCV: 83 fL (ref 79–97)
Monocytes Absolute: 0.6 10*3/uL (ref 0.1–0.9)
Monocytes: 11 %
Neutrophils Absolute: 3.4 10*3/uL (ref 1.4–7.0)
Neutrophils: 68 %
Platelets: 205 10*3/uL (ref 150–450)
RBC: 5.07 x10E6/uL (ref 3.77–5.28)
RDW: 14.5 % (ref 11.7–15.4)
WBC: 5 10*3/uL (ref 3.4–10.8)

## 2023-02-24 LAB — VITAMIN B12: Vitamin B-12: 501 pg/mL (ref 232–1245)

## 2023-02-24 LAB — HEMOGLOBIN A1C
Est. average glucose Bld gHb Est-mCnc: 160 mg/dL
Hgb A1c MFr Bld: 7.2 % — ABNORMAL HIGH (ref 4.8–5.6)

## 2023-02-24 MED ORDER — LIOTHYRONINE SODIUM 5 MCG PO TABS
5.0000 ug | ORAL_TABLET | Freq: Every day | ORAL | 3 refills | Status: DC
Start: 2023-02-24 — End: 2023-04-25

## 2023-02-24 MED ORDER — LIOTHYRONINE SODIUM 5 MCG PO TABS
5.0000 ug | ORAL_TABLET | Freq: Every day | ORAL | 3 refills | Status: DC
Start: 2023-02-24 — End: 2023-02-24

## 2023-02-24 NOTE — Progress Notes (Signed)
Spoke with pt who verified DOB and also verbalized understanding.

## 2023-03-14 ENCOUNTER — Other Ambulatory Visit: Payer: Self-pay | Admitting: Cardiovascular Disease

## 2023-03-15 ENCOUNTER — Other Ambulatory Visit: Payer: Self-pay | Admitting: Internal Medicine

## 2023-03-17 DIAGNOSIS — H2513 Age-related nuclear cataract, bilateral: Secondary | ICD-10-CM | POA: Diagnosis not present

## 2023-03-17 DIAGNOSIS — H04123 Dry eye syndrome of bilateral lacrimal glands: Secondary | ICD-10-CM | POA: Diagnosis not present

## 2023-03-17 DIAGNOSIS — E1165 Type 2 diabetes mellitus with hyperglycemia: Secondary | ICD-10-CM | POA: Diagnosis not present

## 2023-03-17 DIAGNOSIS — H25013 Cortical age-related cataract, bilateral: Secondary | ICD-10-CM | POA: Diagnosis not present

## 2023-03-17 DIAGNOSIS — H18513 Endothelial corneal dystrophy, bilateral: Secondary | ICD-10-CM | POA: Diagnosis not present

## 2023-03-22 ENCOUNTER — Telehealth: Payer: Self-pay | Admitting: Internal Medicine

## 2023-03-22 NOTE — Telephone Encounter (Signed)
Patient left VM requesting a call back from me.  I called patient back and had to leave a VM.

## 2023-03-23 ENCOUNTER — Telehealth: Payer: Self-pay

## 2023-03-29 ENCOUNTER — Other Ambulatory Visit: Payer: Self-pay | Admitting: Cardiology

## 2023-03-29 ENCOUNTER — Telehealth: Payer: Self-pay | Admitting: Internal Medicine

## 2023-03-29 MED ORDER — AMOXICILLIN-POT CLAVULANATE 875-125 MG PO TABS: 1.0000 | ORAL_TABLET | Freq: Two times a day (BID) | ORAL | 0 refills | Status: AC

## 2023-03-29 MED ORDER — FLUTICASONE PROPIONATE 50 MCG/ACT NA SUSP: 2.0000 | Freq: Every day | NASAL | 11 refills | Status: AC

## 2023-03-29 NOTE — Telephone Encounter (Signed)
Patient called in that she is having stuffy nose, green sputum, cough, congestion, ear pain - requesting something be sent in to [facility] [address]. Has tested for Covid and it was negative. Patient has no one to bring her in and has a long drive here from [place] so doesn't feel up to coming in. Please advise.

## 2023-03-30 NOTE — Telephone Encounter (Signed)
Completed by Katherine Mantle.

## 2023-04-03 DIAGNOSIS — H25811 Combined forms of age-related cataract, right eye: Secondary | ICD-10-CM | POA: Diagnosis not present

## 2023-04-03 DIAGNOSIS — H25011 Cortical age-related cataract, right eye: Secondary | ICD-10-CM | POA: Diagnosis not present

## 2023-04-05 DIAGNOSIS — C50311 Malignant neoplasm of lower-inner quadrant of right female breast: Secondary | ICD-10-CM | POA: Diagnosis not present

## 2023-04-06 DIAGNOSIS — C50311 Malignant neoplasm of lower-inner quadrant of right female breast: Secondary | ICD-10-CM | POA: Diagnosis not present

## 2023-04-06 DIAGNOSIS — Z170 Estrogen receptor positive status [ER+]: Secondary | ICD-10-CM | POA: Diagnosis not present

## 2023-04-11 DIAGNOSIS — H25012 Cortical age-related cataract, left eye: Secondary | ICD-10-CM | POA: Diagnosis not present

## 2023-04-11 DIAGNOSIS — H5371 Glare sensitivity: Secondary | ICD-10-CM | POA: Diagnosis not present

## 2023-04-11 DIAGNOSIS — H2512 Age-related nuclear cataract, left eye: Secondary | ICD-10-CM | POA: Diagnosis not present

## 2023-04-12 ENCOUNTER — Other Ambulatory Visit: Payer: Self-pay | Admitting: Internal Medicine

## 2023-04-17 DIAGNOSIS — H2512 Age-related nuclear cataract, left eye: Secondary | ICD-10-CM | POA: Diagnosis not present

## 2023-04-17 DIAGNOSIS — H25812 Combined forms of age-related cataract, left eye: Secondary | ICD-10-CM | POA: Diagnosis not present

## 2023-04-24 ENCOUNTER — Ambulatory Visit (INDEPENDENT_AMBULATORY_CARE_PROVIDER_SITE_OTHER): Payer: Medicare HMO | Admitting: Internal Medicine

## 2023-04-24 ENCOUNTER — Encounter: Payer: Self-pay | Admitting: Internal Medicine

## 2023-04-24 ENCOUNTER — Ambulatory Visit: Payer: Medicare HMO | Admitting: Internal Medicine

## 2023-04-24 VITALS — BP 108/70 | HR 63 | Ht 66.0 in | Wt 198.0 lb

## 2023-04-24 DIAGNOSIS — I10 Essential (primary) hypertension: Secondary | ICD-10-CM

## 2023-04-24 DIAGNOSIS — E1165 Type 2 diabetes mellitus with hyperglycemia: Secondary | ICD-10-CM | POA: Diagnosis not present

## 2023-04-24 DIAGNOSIS — E039 Hypothyroidism, unspecified: Secondary | ICD-10-CM | POA: Diagnosis not present

## 2023-04-24 DIAGNOSIS — I2510 Atherosclerotic heart disease of native coronary artery without angina pectoris: Secondary | ICD-10-CM | POA: Diagnosis not present

## 2023-04-24 DIAGNOSIS — E782 Mixed hyperlipidemia: Secondary | ICD-10-CM

## 2023-04-24 LAB — POCT CBG (FASTING - GLUCOSE)-MANUAL ENTRY: Glucose Fasting, POC: 100 mg/dL — AB (ref 70–99)

## 2023-04-24 NOTE — Progress Notes (Signed)
Established Patient Office Visit  Subjective:  Patient ID: Sheri Bennett, female    DOB: May 07, 1946  Age: 77 y.o. MRN: [id number]  Chief Complaint  Patient presents with   Follow-up    3 month follow up    Patient is here for her follow-up.  She recently had both cataract surgeries.  Also up-to-date on her immunizations. Needs repeat thyroid panel.  Currently on Levoxyl, but did not pick up Cytomel. Denies chest pain, no shortness of breath, no headaches or dizziness.    No other concerns at this time.   Past Medical History:  Diagnosis Date   CAD S/P percutaneous coronary angioplasty    Diabetes mellitus    Hyperlipemia    Hypertension    Malignant neoplasm of right breast [facility])     Past Surgical History:  Procedure Laterality Date   TONSILLECTOMY      Social History   Socioeconomic History   Marital status: Widowed    Spouse name: Not on file   Number of children: Not on file   Years of education: Not on file   Highest education level: Not on file  Occupational History   Not on file  Tobacco Use   Smoking status: Never   Smokeless tobacco: Not on file  Substance and Sexual Activity   Alcohol use: No   Drug use: No   Sexual activity: Never  Other Topics Concern   Not on file  Social History Narrative   Not on file   Social Determinants of Health   Financial Resource Strain: Patient Declined (08/20/2022)   Received from [facility], [facility]   Overall Financial Resource Strain (CARDIA)    Difficulty of Paying Living Expenses: Patient declined  Food Insecurity: Patient Declined (08/20/2022)   Received from [facility], [facility]   Hunger Vital Sign    Worried About Running Out of Food in the Last Year: Patient declined    Ran Out of Food in the Last Year: Patient declined  Transportation Needs: Patient Declined (08/20/2022)   Received from [facility]   [facility] - [facility]    Lack of Transportation (Medical):  Patient declined    Lack of Transportation (Non-Medical): Patient declined  Physical Activity: Sufficiently Active (08/01/2021)   Received from [facility], [facility]   Exercise Vital Sign    Days of Exercise per Week: 3 days    Minutes of Exercise per Session: 120 min  Stress: No Stress Concern Present (08/05/2021)   Received from [facility], [facility]   [employer] of Occupational Health - Occupational Stress Questionnaire    Feeling of Stress : Only a little  Social Connections: Unknown (11/20/2021)   Received from [facility], [facility]   Social Network    Social Network: Not on file  Intimate Partner Violence: Unknown (10/11/2021)   Received from [facility], [facility]   HITS    Physically Hurt: Not on file    Insult or Talk Down To: Not on file    Threaten Physical Harm: Not on file    Scream or Curse: Not on file    History reviewed. No pertinent family history.  Allergies  Allergen Reactions   Benadryl [Diphenhydramine]    Codeine     Review of Systems  Constitutional: Negative.  Negative for chills, diaphoresis, fever, malaise/fatigue and weight loss.  HENT: Negative.    Eyes: Negative.   Respiratory: Negative.  Negative for cough and shortness of breath.   Cardiovascular:  Negative.  Negative for chest pain, palpitations and leg swelling.  Gastrointestinal: Negative.  Negative for abdominal pain, constipation, diarrhea, heartburn, nausea and vomiting.  Genitourinary: Negative.  Negative for dysuria and flank pain.  Musculoskeletal: Negative.  Negative for joint pain and myalgias.  Skin: Negative.   Neurological: Negative.  Negative for dizziness and headaches.  Endo/Heme/Allergies: Negative.   Psychiatric/Behavioral: Negative.  Negative for depression and suicidal ideas. The patient is not nervous/anxious.        Objective:   BP 108/70   Pulse 63   Wt 198 lb (89.8 kg)   SpO2 95%   BMI 31.96 kg/m   Vitals:   04/24/23 1033   BP: 108/70  Pulse: 63  Weight: 198 lb (89.8 kg)  SpO2: 95%    Physical Exam Vitals and nursing note reviewed.  Constitutional:      Appearance: Normal appearance.  HENT:     Head: Normocephalic and atraumatic.     Nose: Nose normal.     Mouth/Throat:     Mouth: Mucous membranes are moist.     Pharynx: Oropharynx is clear.  Eyes:     Conjunctiva/sclera: Conjunctivae normal.     Pupils: Pupils are equal, round, and reactive to light.  Cardiovascular:     Rate and Rhythm: Normal rate and regular rhythm.     Pulses: Normal pulses.     Heart sounds: Normal heart sounds. No murmur heard. Pulmonary:     Effort: Pulmonary effort is normal.     Breath sounds: Normal breath sounds. No wheezing.  Abdominal:     General: Bowel sounds are normal.     Palpations: Abdomen is soft.     Tenderness: There is no abdominal tenderness. There is no right CVA tenderness or left CVA tenderness.  Musculoskeletal:        General: Normal range of motion.     Cervical back: Normal range of motion.     Right lower leg: No edema.     Left lower leg: No edema.  Skin:    General: Skin is warm and dry.  Neurological:     General: No focal deficit present.     Mental Status: She is alert and oriented to person, place, and time.  Psychiatric:        Mood and Affect: Mood normal.        Behavior: Behavior normal.      Results for orders placed or performed in visit on 04/24/23  POCT CBG (Fasting - Glucose)  Result Value Ref Range   Glucose Fasting, POC 100 (A) 70 - 99 mg/dL    Recent Results (from the past 2160 hour(s))  POCT CBG (Fasting - Glucose)     Status: Abnormal   Collection Time: 02/23/23 10:34 AM  Result Value Ref Range   Glucose Fasting, POC 119 (A) 70 - 99 mg/dL  CBC with Diff     Status: Abnormal   Collection Time: 02/23/23 11:19 AM  Result Value Ref Range   WBC 5.0 3.4 - 10.8 x10E3/uL   RBC 5.07 3.77 - 5.28 x10E6/uL   Hemoglobin 13.4 11.1 - 15.9 g/dL   Hematocrit 27.2 53.6  - 46.6 %   MCV 83 79 - 97 fL   MCH 26.4 (L) 26.6 - 33.0 pg   MCHC 32.0 31.5 - 35.7 g/dL   RDW 64.4 03.4 - 74.2 %   Platelets 205 150 - 450 x10E3/uL   Neutrophils 68 Not Estab. %   Lymphs 20 Not Estab. %  Monocytes 11 Not Estab. %   Eos 0 Not Estab. %   Basos 0 Not Estab. %   Neutrophils Absolute 3.4 1.4 - 7.0 x10E3/uL   Lymphocytes Absolute 1.0 0.7 - 3.1 x10E3/uL   Monocytes Absolute 0.6 0.1 - 0.9 x10E3/uL   EOS (ABSOLUTE) 0.0 0.0 - 0.4 x10E3/uL   Basophils Absolute 0.0 0.0 - 0.2 x10E3/uL   Immature Granulocytes 1 Not Estab. %   Immature Grans (Abs) 0.0 0.0 - 0.1 x10E3/uL  CMP14+EGFR     Status: Abnormal   Collection Time: 02/23/23 11:19 AM  Result Value Ref Range   Glucose 90 70 - 99 mg/dL   BUN 12 8 - 27 mg/dL   Creatinine, Ser 0.27 (H) 0.57 - 1.00 mg/dL   eGFR 58 (L) >25 DG/UYQ/0.34   BUN/Creatinine Ratio 12 12 - 28   Sodium 145 (H) 134 - 144 mmol/L   Potassium 3.6 3.5 - 5.2 mmol/L   Chloride 103 96 - 106 mmol/L   CO2 27 20 - 29 mmol/L   Calcium 9.4 8.7 - 10.3 mg/dL   Total Protein 6.6 6.0 - 8.5 g/dL   Albumin 4.2 3.8 - 4.8 g/dL   Globulin, Total 2.4 1.5 - 4.5 g/dL   Bilirubin Total 0.4 0.0 - 1.2 mg/dL   Alkaline Phosphatase 125 (H) 44 - 121 IU/L   AST 16 0 - 40 IU/L   ALT 16 0 - 32 IU/L  Lipid Panel w/o Chol/HDL Ratio     Status: Abnormal   Collection Time: 02/23/23 11:19 AM  Result Value Ref Range   Cholesterol, Total 185 100 - 199 mg/dL   Triglycerides 742 0 - 149 mg/dL   HDL 59 >59 mg/dL   VLDL Cholesterol Cal 21 5 - 40 mg/dL   LDL Chol Calc (NIH) 563 (H) 0 - 99 mg/dL  OVF+I4P+P2RJJO     Status: Abnormal   Collection Time: 02/23/23 11:19 AM  Result Value Ref Range   TSH 5.030 (H) 0.450 - 4.500 uIU/mL   T3, Free 1.8 (L) 2.0 - 4.4 pg/mL   Free T4 1.16 0.82 - 1.77 ng/dL  Hemoglobin A4Z     Status: Abnormal   Collection Time: 02/23/23 11:19 AM  Result Value Ref Range   Hgb A1c MFr Bld 7.2 (H) 4.8 - 5.6 %    Comment:          Prediabetes: 5.7 - 6.4           Diabetes: >6.4          Glycemic control for adults with diabetes: <7.0    Est. average glucose Bld gHb Est-mCnc 160 mg/dL  Vitamin Y60     Status: None   Collection Time: 02/23/23 11:19 AM  Result Value Ref Range   Vitamin B-12 501 232 - 1,245 pg/mL  POCT CBG (Fasting - Glucose)     Status: Abnormal   Collection Time: 04/24/23 10:41 AM  Result Value Ref Range   Glucose Fasting, POC 100 (A) 70 - 99 mg/dL      Assessment & Plan:  Patient will continue current medications. Repeat thyroid panel today. Problem List Items Addressed This Visit     Coronary artery disease involving native coronary artery of native heart without angina pectoris   Essential hypertension, benign   Type 2 diabetes mellitus with hyperglycemia, without long-term current use of insulin (HCC) - Primary   Relevant Orders   POCT CBG (Fasting - Glucose) (Completed)   Mixed hyperlipidemia   Hypothyroidism  Relevant Orders   TSH+T4F+T3Free    Return in about 2 months (around 06/24/2023).   Total time spent: 30 minutes  [name], MD  04/24/2023   This document may have been prepared by [facility] Voice Recognition software and as such may include unintentional dictation errors.

## 2023-04-25 ENCOUNTER — Other Ambulatory Visit: Payer: Self-pay | Admitting: Internal Medicine

## 2023-04-25 DIAGNOSIS — E039 Hypothyroidism, unspecified: Secondary | ICD-10-CM

## 2023-04-25 LAB — TSH+T4F+T3FREE
Free T4: 0.8 ng/dL — ABNORMAL LOW (ref 0.82–1.77)
T3, Free: 1.7 pg/mL — ABNORMAL LOW (ref 2.0–4.4)
TSH: 14.4 u[IU]/mL — ABNORMAL HIGH (ref 0.450–4.500)

## 2023-04-25 MED ORDER — LIOTHYRONINE SODIUM 5 MCG PO TABS: 5.0000 ug | ORAL_TABLET | Freq: Every day | ORAL | 3 refills | Status: DC

## 2023-04-25 MED ORDER — LEVOTHYROXINE SODIUM 137 MCG PO TABS
137.0000 ug | ORAL_TABLET | Freq: Every day | ORAL | 6 refills | Status: DC
Start: 2023-04-25 — End: 2023-05-03

## 2023-05-03 ENCOUNTER — Other Ambulatory Visit: Payer: Self-pay

## 2023-05-03 DIAGNOSIS — E039 Hypothyroidism, unspecified: Secondary | ICD-10-CM

## 2023-05-03 MED ORDER — LIOTHYRONINE SODIUM 5 MCG PO TABS: 5.0000 ug | ORAL_TABLET | Freq: Every day | ORAL | 3 refills | Status: DC

## 2023-05-03 MED ORDER — LEVOTHYROXINE SODIUM 137 MCG PO TABS: 137.0000 ug | ORAL_TABLET | Freq: Every day | ORAL | 6 refills | Status: DC

## 2023-05-03 NOTE — Progress Notes (Signed)
Patient notified and verbalized understanding. 

## 2023-05-11 ENCOUNTER — Telehealth: Payer: Self-pay

## 2023-05-11 NOTE — Telephone Encounter (Signed)
If pt calls back let her know I will call her back after lunch-HQ

## 2023-05-22 ENCOUNTER — Other Ambulatory Visit: Payer: Self-pay | Admitting: Internal Medicine

## 2023-05-22 DIAGNOSIS — I10 Essential (primary) hypertension: Secondary | ICD-10-CM

## 2023-05-22 DIAGNOSIS — E782 Mixed hyperlipidemia: Secondary | ICD-10-CM

## 2023-05-22 DIAGNOSIS — E1159 Type 2 diabetes mellitus with other circulatory complications: Secondary | ICD-10-CM

## 2023-05-26 ENCOUNTER — Ambulatory Visit: Payer: Medicare HMO | Admitting: Internal Medicine

## 2023-05-29 ENCOUNTER — Other Ambulatory Visit: Payer: Self-pay | Admitting: Internal Medicine

## 2023-05-29 DIAGNOSIS — R928 Other abnormal and inconclusive findings on diagnostic imaging of breast: Secondary | ICD-10-CM | POA: Diagnosis not present

## 2023-05-29 DIAGNOSIS — R92333 Mammographic heterogeneous density, bilateral breasts: Secondary | ICD-10-CM | POA: Diagnosis not present

## 2023-05-29 DIAGNOSIS — F411 Generalized anxiety disorder: Secondary | ICD-10-CM

## 2023-05-29 MED ORDER — LORAZEPAM 1 MG PO TABS
1.0000 mg | ORAL_TABLET | Freq: Every evening | ORAL | 0 refills | Status: DC | PRN
Start: 2023-05-29 — End: 2023-11-28

## 2023-06-19 DIAGNOSIS — C50311 Malignant neoplasm of lower-inner quadrant of right female breast: Secondary | ICD-10-CM | POA: Diagnosis not present

## 2023-06-19 DIAGNOSIS — Z170 Estrogen receptor positive status [ER+]: Secondary | ICD-10-CM | POA: Diagnosis not present

## 2023-06-26 ENCOUNTER — Ambulatory Visit: Payer: Medicare HMO | Admitting: Internal Medicine

## 2023-08-08 DIAGNOSIS — M722 Plantar fascial fibromatosis: Secondary | ICD-10-CM | POA: Diagnosis not present

## 2023-08-08 DIAGNOSIS — M6701 Short Achilles tendon (acquired), right ankle: Secondary | ICD-10-CM | POA: Diagnosis not present

## 2023-08-16 DIAGNOSIS — M722 Plantar fascial fibromatosis: Secondary | ICD-10-CM | POA: Diagnosis not present

## 2023-08-16 DIAGNOSIS — M6701 Short Achilles tendon (acquired), right ankle: Secondary | ICD-10-CM | POA: Diagnosis not present

## 2023-08-17 DIAGNOSIS — G8929 Other chronic pain: Secondary | ICD-10-CM | POA: Diagnosis not present

## 2023-08-17 DIAGNOSIS — M25561 Pain in right knee: Secondary | ICD-10-CM | POA: Diagnosis not present

## 2023-08-17 DIAGNOSIS — M1711 Unilateral primary osteoarthritis, right knee: Secondary | ICD-10-CM | POA: Diagnosis not present

## 2023-09-14 ENCOUNTER — Other Ambulatory Visit: Payer: Self-pay | Admitting: Internal Medicine

## 2023-09-25 DIAGNOSIS — M722 Plantar fascial fibromatosis: Secondary | ICD-10-CM | POA: Diagnosis not present

## 2023-09-25 DIAGNOSIS — M6701 Short Achilles tendon (acquired), right ankle: Secondary | ICD-10-CM | POA: Diagnosis not present

## 2023-09-26 ENCOUNTER — Ambulatory Visit (INDEPENDENT_AMBULATORY_CARE_PROVIDER_SITE_OTHER): Payer: Medicare HMO | Admitting: Internal Medicine

## 2023-09-26 ENCOUNTER — Encounter: Payer: Self-pay | Admitting: Internal Medicine

## 2023-09-26 VITALS — BP 110/70 | HR 71 | Ht 66.0 in | Wt 203.0 lb

## 2023-09-26 DIAGNOSIS — E782 Mixed hyperlipidemia: Secondary | ICD-10-CM | POA: Diagnosis not present

## 2023-09-26 DIAGNOSIS — I2510 Atherosclerotic heart disease of native coronary artery without angina pectoris: Secondary | ICD-10-CM

## 2023-09-26 DIAGNOSIS — I152 Hypertension secondary to endocrine disorders: Secondary | ICD-10-CM

## 2023-09-26 DIAGNOSIS — E1165 Type 2 diabetes mellitus with hyperglycemia: Secondary | ICD-10-CM

## 2023-09-26 DIAGNOSIS — E039 Hypothyroidism, unspecified: Secondary | ICD-10-CM | POA: Diagnosis not present

## 2023-09-26 DIAGNOSIS — E1169 Type 2 diabetes mellitus with other specified complication: Secondary | ICD-10-CM

## 2023-09-26 DIAGNOSIS — I10 Essential (primary) hypertension: Secondary | ICD-10-CM

## 2023-09-26 DIAGNOSIS — E538 Deficiency of other specified B group vitamins: Secondary | ICD-10-CM

## 2023-09-26 DIAGNOSIS — E1159 Type 2 diabetes mellitus with other circulatory complications: Secondary | ICD-10-CM

## 2023-09-26 LAB — POCT CBG (FASTING - GLUCOSE)-MANUAL ENTRY: Glucose Fasting, POC: 124 mg/dL — AB (ref 70–99)

## 2023-09-26 NOTE — Progress Notes (Signed)
 Established Patient Office Visit  Subjective:  Patient ID: Sheri Bennett, female    DOB: Nov 09, 1945  Age: 78 y.o. MRN: [id number]  Chief Complaint  Patient presents with   Follow-up    3 month follow up    Patient is here for her follow-up today.  In general she is feeling well has no new complaints.  Has gained some weight due to dietary noncompliance.  Will check labs today.  Plans to go on another trip.  Advised strict diet control.    No other concerns at this time.   Past Medical History:  Diagnosis Date   CAD S/P percutaneous coronary angioplasty    Diabetes mellitus    Hyperlipemia    Hypertension    Malignant neoplasm of right breast [facility])     Past Surgical History:  Procedure Laterality Date   TONSILLECTOMY      Social History   Socioeconomic History   Marital status: Widowed    Spouse name: Not on file   Number of children: Not on file   Years of education: Not on file   Highest education level: Not on file  Occupational History   Not on file  Tobacco Use   Smoking status: Never   Smokeless tobacco: Not on file  Substance and Sexual Activity   Alcohol use: No   Drug use: No   Sexual activity: Never  Other Topics Concern   Not on file  Social History Narrative   Not on file   Social Drivers of Health   Financial Resource Strain: Patient Declined (08/17/2023)   Received from [facility]   Overall Financial Resource Strain (CARDIA)    Difficulty of Paying Living Expenses: Patient declined  Food Insecurity: Patient Declined (08/17/2023)   Received from [facility]   Hunger Vital Sign    Worried About Running Out of Food in the Last Year: Patient declined    Ran Out of Food in the Last Year: Patient declined  Transportation Needs: Patient Declined (08/17/2023)   [facility] from [facility] - [facility]    Lack of Transportation (Medical): Patient declined    Lack of Transportation (Non-Medical): Patient declined  Physical  Activity: Sufficiently Active (08/01/2021)   Received from [facility], [facility]   Exercise Vital Sign    Days of Exercise per Week: 3 days    Minutes of Exercise per Session: 120 min  Stress: No Stress Concern Present (08/05/2021)   Received from [facility], [facility]   [employer] of Occupational Health - Occupational Stress Questionnaire    Feeling of Stress : Only a little  Social Connections: Unknown (11/20/2021)   Received from [facility], [facility]   Social Network    Social Network: Not on file  Intimate Partner Violence: Unknown (10/11/2021)   Received from [facility], [facility]   HITS    Physically Hurt: Not on file    Insult or Talk Down To: Not on file    Threaten Physical Harm: Not on file    Scream or Curse: Not on file    History reviewed. No pertinent family history.  Allergies  Allergen Reactions   Benadryl [Diphenhydramine]    Codeine     Outpatient Medications Prior to Visit  Medication Sig   atorvastatin (LIPITOR) 80 MG tablet TAKE 1 TABLET EVERY DAY   clopidogrel (PLAVIX) 75 MG tablet TAKE 1 TABLET EVERY DAY   fluticasone (FLONASE) 50 MCG/ACT nasal spray Place 2 sprays into both  nostrils daily.   JARDIANCE 25 MG TABS tablet TAKE 1 TABLET BY MOUTH EVERY DAY   levothyroxine (LEVOXYL) 137 MCG tablet Take 1 tablet (137 mcg total) by mouth daily before breakfast.   LORazepam (ATIVAN) 1 MG tablet Take 1 tablet (1 mg total) by mouth at bedtime as needed for anxiety.   losartan-hydrochlorothiazide (HYZAAR) 100-25 MG tablet TAKE 1 TABLET EVERY DAY   metoprolol tartrate (LOPRESSOR) 50 MG tablet TAKE 1 TABLET TWICE DAILY   pantoprazole (PROTONIX) 40 MG tablet TAKE 1 TABLET EVERY DAY   potassium chloride SA (KLOR-CON M) 20 MEQ tablet TAKE 1 TABLET EVERY DAY   Calcium Carbonate-Vitamin D (CALTRATE 600+D) 600-400 MG-UNIT per tablet Take 1 tablet by mouth daily. (Patient not taking: Reported on 09/26/2023)   celecoxib (CELEBREX) 200 MG  capsule Take 1 capsule (200 mg total) by mouth daily. (Patient not taking: Reported on 09/26/2023)   fish oil-omega-3 fatty acids 1000 MG capsule Take 1 g by mouth daily. (Patient not taking: Reported on 02/23/2023)   Lifitegrast (XIIDRA) 5 % SOLN One drop in each eye every 12 hours (Patient not taking: Reported on 09/26/2023)   liothyronine (CYTOMEL) 5 MCG tablet Take 1 tablet (5 mcg total) by mouth daily. (Patient not taking: Reported on 09/26/2023)   potassium chloride (KLOR-CON) 10 MEQ tablet Take one tablet by mouth twice daily for 3 days only, then take one tablet by mouth once daily (Patient not taking: Reported on 09/26/2023)   Semaglutide (RYBELSUS) 7 MG TABS Take 1 tablet (7 mg total) by mouth daily. (Patient not taking: Reported on 09/26/2023)   No facility-administered medications prior to visit.    Review of Systems  Constitutional: Negative.  Negative for chills, fever, malaise/fatigue and weight loss.  HENT: Negative.  Negative for sore throat.   Eyes: Negative.   Respiratory: Negative.  Negative for cough and shortness of breath.   Cardiovascular: Negative.  Negative for chest pain, palpitations and leg swelling.  Gastrointestinal: Negative.  Negative for abdominal pain, constipation, diarrhea, heartburn, nausea and vomiting.  Genitourinary: Negative.  Negative for dysuria and flank pain.  Musculoskeletal: Negative.  Negative for joint pain and myalgias.  Skin: Negative.   Neurological: Negative.  Negative for dizziness and headaches.  Endo/Heme/Allergies: Negative.   Psychiatric/Behavioral: Negative.  Negative for depression and suicidal ideas. The patient is not nervous/anxious.        Objective:   BP 110/70   Pulse 71   Ht 5\' 6"  (1.676 m)   Wt 203 lb (92.1 kg)   SpO2 98%   BMI 32.77 kg/m   Vitals:   09/26/23 1130  BP: 110/70  Pulse: 71  Height: 5\' 6"  (1.676 m)  Weight: 203 lb (92.1 kg)  SpO2: 98%  BMI (Calculated): 32.78    Physical Exam Vitals and nursing  note reviewed.  Constitutional:      Appearance: Normal appearance.  HENT:     Head: Normocephalic and atraumatic.     Nose: Nose normal.     Mouth/Throat:     Mouth: Mucous membranes are moist.     Pharynx: Oropharynx is clear.  Eyes:     Conjunctiva/sclera: Conjunctivae normal.     Pupils: Pupils are equal, round, and reactive to light.  Cardiovascular:     Rate and Rhythm: Normal rate and regular rhythm.     Pulses: Normal pulses.     Heart sounds: Normal heart sounds. No murmur heard. Pulmonary:     Effort: Pulmonary effort is normal.  Breath sounds: Normal breath sounds. No wheezing.  Abdominal:     General: Bowel sounds are normal.     Palpations: Abdomen is soft.     Tenderness: There is no abdominal tenderness. There is no right CVA tenderness or left CVA tenderness.  Musculoskeletal:        General: Normal range of motion.     Cervical back: Normal range of motion.     Right lower leg: No edema.     Left lower leg: No edema.  Skin:    General: Skin is warm and dry.  Neurological:     General: No focal deficit present.     Mental Status: She is alert and oriented to person, place, and time.  Psychiatric:        Mood and Affect: Mood normal.        Behavior: Behavior normal.      Results for orders placed or performed in visit on 09/26/23  POCT CBG (Fasting - Glucose)  Result Value Ref Range   Glucose Fasting, POC 124 (A) 70 - 99 mg/dL    Recent Results (from the past 2160 hours)  POCT CBG (Fasting - Glucose)     Status: Abnormal   Collection Time: 09/26/23 11:37 AM  Result Value Ref Range   Glucose Fasting, POC 124 (A) 70 - 99 mg/dL      Assessment & Plan:  Continue all medications.  Strict diet control.  Labs today. Problem List Items Addressed This Visit     B12 deficiency   Relevant Orders   CBC with Diff   Vitamin B12   Coronary artery disease involving native coronary artery of native heart without angina pectoris   Essential  hypertension, benign   Relevant Orders   CMP14+EGFR   Type 2 diabetes mellitus with hyperglycemia, without long-term current use of insulin (HCC)   Relevant Orders   POCT CBG (Fasting - Glucose) (Completed)   Hemoglobin A1c   Mixed hyperlipidemia   Relevant Orders   Lipid Panel w/o Chol/HDL Ratio   Hypothyroidism   Relevant Orders   TSH+T4F+T3Free   Other Visit Diagnoses       Hypertension associated with diabetes (HCC)    -  Primary     Combined hyperlipidemia associated with type 2 diabetes mellitus (HCC)           Follow up 4 months.  Total time spent: 30 minutes  [name], MD  09/26/2023   This document may have been prepared by [facility] Voice Recognition software and as such may include unintentional dictation errors.

## 2023-09-28 DIAGNOSIS — C50311 Malignant neoplasm of lower-inner quadrant of right female breast: Secondary | ICD-10-CM | POA: Diagnosis not present

## 2023-09-28 DIAGNOSIS — Z170 Estrogen receptor positive status [ER+]: Secondary | ICD-10-CM | POA: Diagnosis not present

## 2023-10-03 LAB — LIPID PANEL W/O CHOL/HDL RATIO
Cholesterol, Total: 165 mg/dL (ref 100–199)
HDL: 57 mg/dL (ref >39)
LDL Chol Calc (NIH): 86 mg/dL (ref 0–99)
Triglycerides: 123 mg/dL (ref 0–149)
VLDL Cholesterol Cal: 22 mg/dL (ref 5–40)

## 2023-10-03 LAB — CBC WITH DIFFERENTIAL/PLATELET
Basophils Absolute: 0 10*3/uL (ref 0.0–0.2)
Basos: 0 %
EOS (ABSOLUTE): 0 10*3/uL (ref 0.0–0.4)
Eos: 0 %
Hematocrit: 39.4 % (ref 34.0–46.6)
Hemoglobin: 12.6 g/dL (ref 11.1–15.9)
Immature Grans (Abs): 0 10*3/uL (ref 0.0–0.1)
Immature Granulocytes: 1 %
Lymphocytes Absolute: 1.2 10*3/uL (ref 0.7–3.1)
Lymphs: 24 %
MCH: 26 pg — ABNORMAL LOW (ref 26.6–33.0)
MCHC: 32 g/dL (ref 31.5–35.7)
MCV: 81 fL (ref 79–97)
Monocytes Absolute: 0.6 10*3/uL (ref 0.1–0.9)
Monocytes: 11 %
Neutrophils Absolute: 3.3 10*3/uL (ref 1.4–7.0)
Neutrophils: 64 %
Platelets: 221 10*3/uL (ref 150–450)
RBC: 4.85 x10E6/uL (ref 3.77–5.28)
RDW: 14 % (ref 11.7–15.4)
WBC: 5.1 10*3/uL (ref 3.4–10.8)

## 2023-10-03 LAB — CMP14+EGFR
ALT: 16 IU/L (ref 0–32)
AST: 21 IU/L (ref 0–40)
Albumin: 4.2 g/dL (ref 3.8–4.8)
Alkaline Phosphatase: 137 IU/L — ABNORMAL HIGH (ref 44–121)
BUN/Creatinine Ratio: 14 (ref 12–28)
BUN: 12 mg/dL (ref 8–27)
Bilirubin Total: 0.4 mg/dL (ref 0.0–1.2)
CO2: 25 mmol/L (ref 20–29)
Calcium: 9.2 mg/dL (ref 8.7–10.3)
Chloride: 101 mmol/L (ref 96–106)
Creatinine, Ser: 0.87 mg/dL (ref 0.57–1.00)
Globulin, Total: 2.1 g/dL (ref 1.5–4.5)
Glucose: 109 mg/dL — ABNORMAL HIGH (ref 70–99)
Potassium: 3.7 mmol/L (ref 3.5–5.2)
Sodium: 140 mmol/L (ref 134–144)
Total Protein: 6.3 g/dL (ref 6.0–8.5)
eGFR: 69 mL/min/{1.73_m2} (ref >59)

## 2023-10-03 LAB — HEMOGLOBIN A1C
Est. average glucose Bld gHb Est-mCnc: 169 mg/dL
Hgb A1c MFr Bld: 7.5 % — ABNORMAL HIGH (ref 4.8–5.6)

## 2023-10-03 LAB — TSH+T4F+T3FREE
Free T4: 1.59 ng/dL (ref 0.82–1.77)
T3, Free: 2.6 pg/mL (ref 2.0–4.4)
TSH: 0.563 u[IU]/mL (ref 0.450–4.500)

## 2023-10-03 LAB — VITAMIN B12: Vitamin B-12: 356 pg/mL (ref 232–1245)

## 2023-10-05 ENCOUNTER — Other Ambulatory Visit: Payer: Self-pay | Admitting: Internal Medicine

## 2023-10-05 ENCOUNTER — Other Ambulatory Visit: Payer: Self-pay

## 2023-10-05 DIAGNOSIS — E1165 Type 2 diabetes mellitus with hyperglycemia: Secondary | ICD-10-CM

## 2023-10-05 MED ORDER — GLIMEPIRIDE 2 MG PO TABS: 2.0000 mg | ORAL_TABLET | Freq: Two times a day (BID) | ORAL | 11 refills | Status: AC

## 2023-10-05 MED ORDER — GLIMEPIRIDE 2 MG PO TABS: 2.0000 mg | ORAL_TABLET | Freq: Two times a day (BID) | ORAL | 11 refills | Status: DC

## 2023-10-18 DIAGNOSIS — M722 Plantar fascial fibromatosis: Secondary | ICD-10-CM | POA: Diagnosis not present

## 2023-10-18 DIAGNOSIS — M6701 Short Achilles tendon (acquired), right ankle: Secondary | ICD-10-CM | POA: Diagnosis not present

## 2023-11-20 ENCOUNTER — Telehealth: Payer: Self-pay | Admitting: Internal Medicine

## 2023-11-20 NOTE — Telephone Encounter (Signed)
 Patient left VM that she needs more Rybelsus , she is running out.   Called patient back and informed her to contact the patient assistance program. She said she never heard back from them on if she was approved or not.  I will call patient assistance and check the status of this.

## 2023-11-21 DIAGNOSIS — H1013 Acute atopic conjunctivitis, bilateral: Secondary | ICD-10-CM | POA: Diagnosis not present

## 2023-11-21 DIAGNOSIS — H01001 Unspecified blepharitis right upper eyelid: Secondary | ICD-10-CM | POA: Diagnosis not present

## 2023-11-21 DIAGNOSIS — H18513 Endothelial corneal dystrophy, bilateral: Secondary | ICD-10-CM | POA: Diagnosis not present

## 2023-11-21 DIAGNOSIS — H04123 Dry eye syndrome of bilateral lacrimal glands: Secondary | ICD-10-CM | POA: Diagnosis not present

## 2023-11-21 DIAGNOSIS — H01004 Unspecified blepharitis left upper eyelid: Secondary | ICD-10-CM | POA: Diagnosis not present

## 2023-11-21 DIAGNOSIS — Z961 Presence of intraocular lens: Secondary | ICD-10-CM | POA: Diagnosis not present

## 2023-11-28 ENCOUNTER — Other Ambulatory Visit: Payer: Self-pay | Admitting: Internal Medicine

## 2023-11-28 DIAGNOSIS — F411 Generalized anxiety disorder: Secondary | ICD-10-CM

## 2023-11-29 ENCOUNTER — Other Ambulatory Visit: Payer: Self-pay

## 2023-11-29 ENCOUNTER — Telehealth: Payer: Self-pay | Admitting: Internal Medicine

## 2023-11-29 DIAGNOSIS — E039 Hypothyroidism, unspecified: Secondary | ICD-10-CM

## 2023-11-29 NOTE — Telephone Encounter (Signed)
 Patient left VM requesting a call back from me. Did not state what she is needing.

## 2023-11-30 MED ORDER — LEVOTHYROXINE SODIUM 137 MCG PO TABS: 137.0000 ug | ORAL_TABLET | Freq: Every day | ORAL | 1 refills | Status: DC

## 2023-12-26 ENCOUNTER — Telehealth: Payer: Self-pay | Admitting: Internal Medicine

## 2023-12-26 NOTE — Telephone Encounter (Signed)
 Left VM needing a call back regarding patient's re-enrollment. Patient's ID # is [id number]

## 2023-12-27 DIAGNOSIS — M722 Plantar fascial fibromatosis: Secondary | ICD-10-CM | POA: Diagnosis not present

## 2023-12-27 DIAGNOSIS — M6701 Short Achilles tendon (acquired), right ankle: Secondary | ICD-10-CM | POA: Diagnosis not present

## 2024-01-16 DIAGNOSIS — R531 Weakness: Secondary | ICD-10-CM | POA: Diagnosis not present

## 2024-01-16 DIAGNOSIS — R29898 Other symptoms and signs involving the musculoskeletal system: Secondary | ICD-10-CM | POA: Diagnosis not present

## 2024-01-16 DIAGNOSIS — M6289 Other specified disorders of muscle: Secondary | ICD-10-CM | POA: Diagnosis not present

## 2024-01-16 DIAGNOSIS — Z7409 Other reduced mobility: Secondary | ICD-10-CM | POA: Diagnosis not present

## 2024-01-16 DIAGNOSIS — M6701 Short Achilles tendon (acquired), right ankle: Secondary | ICD-10-CM | POA: Diagnosis not present

## 2024-01-24 DIAGNOSIS — G8929 Other chronic pain: Secondary | ICD-10-CM | POA: Diagnosis not present

## 2024-01-24 DIAGNOSIS — M1711 Unilateral primary osteoarthritis, right knee: Secondary | ICD-10-CM | POA: Diagnosis not present

## 2024-01-24 DIAGNOSIS — M25561 Pain in right knee: Secondary | ICD-10-CM | POA: Diagnosis not present

## 2024-01-25 ENCOUNTER — Ambulatory Visit: Payer: Self-pay | Admitting: Internal Medicine

## 2024-01-25 ENCOUNTER — Ambulatory Visit (INDEPENDENT_AMBULATORY_CARE_PROVIDER_SITE_OTHER): Admitting: Internal Medicine

## 2024-01-25 ENCOUNTER — Encounter: Payer: Self-pay | Admitting: Internal Medicine

## 2024-01-25 VITALS — BP 136/68 | HR 72 | Ht 66.0 in | Wt 210.6 lb

## 2024-01-25 DIAGNOSIS — E1165 Type 2 diabetes mellitus with hyperglycemia: Secondary | ICD-10-CM

## 2024-01-25 DIAGNOSIS — E782 Mixed hyperlipidemia: Secondary | ICD-10-CM | POA: Diagnosis not present

## 2024-01-25 DIAGNOSIS — I10 Essential (primary) hypertension: Secondary | ICD-10-CM

## 2024-01-25 DIAGNOSIS — E1169 Type 2 diabetes mellitus with other specified complication: Secondary | ICD-10-CM

## 2024-01-25 DIAGNOSIS — E039 Hypothyroidism, unspecified: Secondary | ICD-10-CM

## 2024-01-25 DIAGNOSIS — E538 Deficiency of other specified B group vitamins: Secondary | ICD-10-CM | POA: Diagnosis not present

## 2024-01-25 LAB — POC CREATINE & ALBUMIN,URINE
Creatinine, POC: 200 mg/dL
Microalbumin Ur, POC: 80 mg/L

## 2024-01-25 LAB — POCT CBG (FASTING - GLUCOSE)-MANUAL ENTRY: Glucose Fasting, POC: 318 mg/dL — AB (ref 70–99)

## 2024-01-25 NOTE — Progress Notes (Signed)
 Established Patient Office Visit  Subjective:  Patient ID: Sheri Bennett, female    DOB: 10/09/1945  Age: 78 y.o. MRN: [id number]  Chief Complaint  Patient presents with   Follow-up    4 month follow up    Patient comes in for follow-up today.  Her initial blood pressure was high as she is undergoing some stress due to family issues.  However her repeat blood pressure later on was normalizing.  She is generally feeling well.  Has gained some weight due to dietary noncompliance but plans to start a strict diet control.  She is fasting for blood work today. No headaches or dizziness, no chest pain and no palpitations. Check urine microalbumin today. Patient has an appointment for her diabetic eye exam.    No other concerns at this time.   Past Medical History:  Diagnosis Date   CAD S/P percutaneous coronary angioplasty    Diabetes mellitus    Hyperlipemia    Hypertension    Malignant neoplasm of right breast [facility])     Past Surgical History:  Procedure Laterality Date   TONSILLECTOMY      Social History   Socioeconomic History   Marital status: Widowed    Spouse name: Not on file   Number of children: Not on file   Years of education: Not on file   Highest education level: Not on file  Occupational History   Not on file  Tobacco Use   Smoking status: Never   Smokeless tobacco: Not on file  Substance and Sexual Activity   Alcohol use: No   Drug use: No   Sexual activity: Never  Other Topics Concern   Not on file  Social History Narrative   Not on file   Social Drivers of Health   Financial Resource Strain: Patient Declined (08/17/2023)   Received from [facility]   Overall Financial Resource Strain (CARDIA)    Difficulty of Paying Living Expenses: Patient declined  Food Insecurity: Patient Declined (08/17/2023)   Received from [facility]   Hunger Vital Sign    Within the past 12 months, you worried that your food would run out before you got the  money to buy more.: Patient declined    Within the past 12 months, the food you bought just didn't last and you didn't have money to get more.: Patient declined  Transportation Needs: Patient Declined (08/17/2023)   Received from [facility] - Transportation    Lack of Transportation (Medical): Patient declined    Lack of Transportation (Non-Medical): Patient declined  Physical Activity: Sufficiently Active (08/01/2021)   Received from [facility]   Exercise Vital Sign    On average, how many days per week do you engage in moderate to strenuous exercise (like a brisk walk)?: 3 days    On average, how many minutes do you engage in exercise at this level?: 120 min  Stress: No Stress Concern Present (08/05/2021)   Received from [facility] of Occupational Health - Occupational Stress Questionnaire    Feeling of Stress : Only a little  Social Connections: Unknown (11/20/2021)   Received from [facility]   Social Network    Social Network: Not on file  Intimate Partner Violence: Unknown (10/11/2021)   Received from [facility]   HITS    Physically Hurt: Not on file    Insult or Talk Down To: Not on file    Threaten Physical Harm: Not on file  Scream or Curse: Not on file    History reviewed. No pertinent family history.  Allergies  Allergen Reactions   Benadryl [Diphenhydramine]    Codeine     Outpatient Medications Prior to Visit  Medication Sig   atorvastatin (LIPITOR) 80 MG tablet TAKE 1 TABLET EVERY DAY   clopidogrel (PLAVIX) 75 MG tablet TAKE 1 TABLET EVERY DAY   fluticasone  (FLONASE ) 50 MCG/ACT nasal spray Place 2 sprays into both nostrils daily.   glimepiride  (AMARYL ) 2 MG tablet Take 1 tablet (2 mg total) by mouth 2 (two) times daily.   levothyroxine  (LEVOXYL ) 137 MCG tablet Take 1 tablet (137 mcg total) by mouth daily before breakfast.   LORazepam  (ATIVAN ) 1 MG tablet TAKE 1 TABLET BY MOUTH AT BEDTIME AS NEEDED FOR ANXIETY    losartan-hydrochlorothiazide (HYZAAR) 100-25 MG tablet TAKE 1 TABLET EVERY DAY   metoprolol tartrate (LOPRESSOR) 50 MG tablet TAKE 1 TABLET TWICE DAILY   pantoprazole (PROTONIX) 40 MG tablet TAKE 1 TABLET EVERY DAY   potassium chloride  SA (KLOR-CON  M) 20 MEQ tablet TAKE 1 TABLET EVERY DAY   Semaglutide  (RYBELSUS ) 7 MG TABS Take 1 tablet (7 mg total) by mouth daily.   Calcium Carbonate-Vitamin D (CALTRATE 600+D) 600-400 MG-UNIT per tablet Take 1 tablet by mouth daily. (Patient not taking: Reported on 01/25/2024)   celecoxib  (CELEBREX ) 200 MG capsule Take 1 capsule (200 mg total) by mouth daily. (Patient not taking: Reported on 01/25/2024)   fish oil-omega-3 fatty acids 1000 MG capsule Take 1 g by mouth daily. (Patient not taking: Reported on 01/25/2024)   JARDIANCE 25 MG TABS tablet TAKE 1 TABLET BY MOUTH EVERY DAY (Patient not taking: Reported on 01/25/2024)   Lifitegrast  (XIIDRA ) 5 % SOLN One drop in each eye every 12 hours (Patient not taking: Reported on 01/25/2024)   liothyronine  (CYTOMEL ) 5 MCG tablet Take 1 tablet (5 mcg total) by mouth daily. (Patient not taking: Reported on 01/25/2024)   potassium chloride  (KLOR-CON ) 10 MEQ tablet Take one tablet by mouth twice daily for 3 days only, then take one tablet by mouth once daily (Patient not taking: Reported on 01/25/2024)   No facility-administered medications prior to visit.    Review of Systems  Constitutional: Negative.  Negative for chills, fever and malaise/fatigue.  HENT: Negative.    Eyes: Negative.   Respiratory: Negative.  Negative for cough and shortness of breath.   Cardiovascular: Negative.  Negative for chest pain, palpitations and leg swelling.  Gastrointestinal: Negative.  Negative for abdominal pain, constipation, diarrhea, heartburn, nausea and vomiting.  Genitourinary: Negative.  Negative for dysuria and flank pain.  Musculoskeletal: Negative.  Negative for joint pain and myalgias.  Skin: Negative.   Neurological: Negative.   Negative for dizziness and headaches.  Endo/Heme/Allergies: Negative.   Psychiatric/Behavioral: Negative.  Negative for depression and suicidal ideas. The patient is not nervous/anxious.        Objective:   BP 136/68   Pulse 72   Ht 5' 6 (1.676 m)   Wt 210 lb 9.6 oz (95.5 kg)   SpO2 99%   BMI 33.99 kg/m   Vitals:   01/25/24 1040 01/25/24 1108  BP: (!) 150/70 136/68  Pulse: 72   Height: 5' 6 (1.676 m)   Weight: 210 lb 9.6 oz (95.5 kg)   SpO2: 99%   BMI (Calculated): 34.01     Physical Exam Vitals and nursing note reviewed.  Constitutional:      Appearance: Normal appearance.  HENT:     Head:  Normocephalic and atraumatic.     Nose: Nose normal.     Mouth/Throat:     Mouth: Mucous membranes are moist.     Pharynx: Oropharynx is clear.  Eyes:     Conjunctiva/sclera: Conjunctivae normal.     Pupils: Pupils are equal, round, and reactive to light.  Cardiovascular:     Rate and Rhythm: Normal rate and regular rhythm.     Pulses: Normal pulses.     Heart sounds: Normal heart sounds. No murmur heard. Pulmonary:     Effort: Pulmonary effort is normal.     Breath sounds: Normal breath sounds. No wheezing.  Abdominal:     General: Bowel sounds are normal.     Palpations: Abdomen is soft.     Tenderness: There is no abdominal tenderness. There is no right CVA tenderness or left CVA tenderness.  Musculoskeletal:        General: Normal range of motion.     Cervical back: Normal range of motion.     Right lower leg: No edema.     Left lower leg: No edema.  Skin:    General: Skin is warm and dry.  Neurological:     General: No focal deficit present.     Mental Status: She is alert and oriented to person, place, and time.  Psychiatric:        Mood and Affect: Mood normal.        Behavior: Behavior normal.      Results for orders placed or performed in visit on 01/25/24  POCT CBG (Fasting - Glucose)  Result Value Ref Range   Glucose Fasting, POC 318 (A) 70 - 99  mg/dL  POC CREATINE & ALBUMIN,URINE  Result Value Ref Range   Microalbumin Ur, POC 80 mg/L   Creatinine, POC 200 mg/dL   Albumin/Creatinine Ratio, Urine, POC 30-300     Recent Results (from the past 2160 hours)  POCT CBG (Fasting - Glucose)     Status: Abnormal   Collection Time: 01/25/24 10:47 AM  Result Value Ref Range   Glucose Fasting, POC 318 (A) 70 - 99 mg/dL  POC CREATINE & ALBUMIN,URINE     Status: None   Collection Time: 01/25/24 11:56 AM  Result Value Ref Range   Microalbumin Ur, POC 80 mg/L   Creatinine, POC 200 mg/dL   Albumin/Creatinine Ratio, Urine, POC 30-300       Assessment & Plan:  Continue current educations.  Check blood work today.  Strict diet control and weight reduction emphasized. Problem List Items Addressed This Visit     B12 deficiency   Relevant Orders   CBC with Diff   Essential hypertension, benign - Primary   Relevant Orders   CMP14+EGFR   Type 2 diabetes mellitus with hyperglycemia, without long-term current use of insulin (HCC)   Relevant Orders   POCT CBG (Fasting - Glucose) (Completed)   Hemoglobin A1c   POC CREATINE & ALBUMIN,URINE (Completed)   Hypothyroidism   Relevant Orders   TSH+T4F+T3Free   Other Visit Diagnoses       Combined hyperlipidemia associated with type 2 diabetes mellitus (HCC)       Relevant Orders   Lipid Panel w/o Chol/HDL Ratio       Return in about 3 months (around 04/26/2024).   Total time spent: 30 minutes  FERNAND FREDY RAMAN, MD  01/25/2024   This document may have been prepared by [facility] Voice Recognition software and as such may include unintentional dictation errors.

## 2024-01-26 ENCOUNTER — Ambulatory Visit: Admitting: Internal Medicine

## 2024-01-26 ENCOUNTER — Other Ambulatory Visit: Payer: Self-pay | Admitting: Internal Medicine

## 2024-01-26 LAB — CBC WITH DIFFERENTIAL/PLATELET
Basophils Absolute: 0 x10E3/uL (ref 0.0–0.2)
Basos: 0 %
EOS (ABSOLUTE): 0 x10E3/uL (ref 0.0–0.4)
Eos: 0 %
Hematocrit: 40.7 % (ref 34.0–46.6)
Hemoglobin: 12.5 g/dL (ref 11.1–15.9)
Immature Grans (Abs): 0 x10E3/uL (ref 0.0–0.1)
Immature Granulocytes: 0 %
Lymphocytes Absolute: 0.6 x10E3/uL — ABNORMAL LOW (ref 0.7–3.1)
Lymphs: 7 %
MCH: 24.9 pg — ABNORMAL LOW (ref 26.6–33.0)
MCHC: 30.7 g/dL — ABNORMAL LOW (ref 31.5–35.7)
MCV: 81 fL (ref 79–97)
Monocytes Absolute: 0.5 x10E3/uL (ref 0.1–0.9)
Monocytes: 6 %
Neutrophils Absolute: 7.6 x10E3/uL — ABNORMAL HIGH (ref 1.4–7.0)
Neutrophils: 87 %
Platelets: 263 x10E3/uL (ref 150–450)
RBC: 5.03 x10E6/uL (ref 3.77–5.28)
RDW: 14.5 % (ref 11.7–15.4)
WBC: 8.8 x10E3/uL (ref 3.4–10.8)

## 2024-01-26 LAB — TSH+T4F+T3FREE
Free T4: 1.2 ng/dL (ref 0.82–1.77)
T3, Free: 1.6 pg/mL — ABNORMAL LOW (ref 2.0–4.4)
TSH: 1.32 u[IU]/mL (ref 0.450–4.500)

## 2024-01-26 LAB — CMP14+EGFR
ALT: 15 IU/L (ref 0–32)
AST: 18 IU/L (ref 0–40)
Albumin: 4.5 g/dL (ref 3.8–4.8)
Alkaline Phosphatase: 152 IU/L — ABNORMAL HIGH (ref 44–121)
BUN/Creatinine Ratio: 14 (ref 12–28)
BUN: 15 mg/dL (ref 8–27)
Bilirubin Total: 0.5 mg/dL (ref 0.0–1.2)
CO2: 20 mmol/L (ref 20–29)
Calcium: 9.5 mg/dL (ref 8.7–10.3)
Chloride: 98 mmol/L (ref 96–106)
Creatinine, Ser: 1.06 mg/dL — ABNORMAL HIGH (ref 0.57–1.00)
Globulin, Total: 2.6 g/dL (ref 1.5–4.5)
Glucose: 284 mg/dL — ABNORMAL HIGH (ref 70–99)
Potassium: 3.4 mmol/L — ABNORMAL LOW (ref 3.5–5.2)
Sodium: 137 mmol/L (ref 134–144)
Total Protein: 7.1 g/dL (ref 6.0–8.5)
eGFR: 54 mL/min/1.73 — ABNORMAL LOW (ref >59)

## 2024-01-26 LAB — HEMOGLOBIN A1C
Est. average glucose Bld gHb Est-mCnc: 186 mg/dL
Hgb A1c MFr Bld: 8.1 % — ABNORMAL HIGH (ref 4.8–5.6)

## 2024-01-26 NOTE — Progress Notes (Signed)
 Patient notified

## 2024-02-05 DIAGNOSIS — R29898 Other symptoms and signs involving the musculoskeletal system: Secondary | ICD-10-CM | POA: Diagnosis not present

## 2024-02-05 DIAGNOSIS — M6701 Short Achilles tendon (acquired), right ankle: Secondary | ICD-10-CM | POA: Diagnosis not present

## 2024-02-05 DIAGNOSIS — Z7409 Other reduced mobility: Secondary | ICD-10-CM | POA: Diagnosis not present

## 2024-02-05 DIAGNOSIS — M6289 Other specified disorders of muscle: Secondary | ICD-10-CM | POA: Diagnosis not present

## 2024-02-05 DIAGNOSIS — R531 Weakness: Secondary | ICD-10-CM | POA: Diagnosis not present

## 2024-03-06 ENCOUNTER — Other Ambulatory Visit: Payer: Self-pay | Admitting: Internal Medicine

## 2024-03-06 DIAGNOSIS — F411 Generalized anxiety disorder: Secondary | ICD-10-CM

## 2024-03-19 ENCOUNTER — Other Ambulatory Visit: Payer: Self-pay | Admitting: Internal Medicine

## 2024-03-19 DIAGNOSIS — E1165 Type 2 diabetes mellitus with hyperglycemia: Secondary | ICD-10-CM

## 2024-03-25 ENCOUNTER — Other Ambulatory Visit: Payer: Self-pay | Admitting: Cardiovascular Disease

## 2024-03-27 DIAGNOSIS — H04123 Dry eye syndrome of bilateral lacrimal glands: Secondary | ICD-10-CM | POA: Diagnosis not present

## 2024-03-27 DIAGNOSIS — H18513 Endothelial corneal dystrophy, bilateral: Secondary | ICD-10-CM | POA: Diagnosis not present

## 2024-03-27 DIAGNOSIS — E1165 Type 2 diabetes mellitus with hyperglycemia: Secondary | ICD-10-CM | POA: Diagnosis not present

## 2024-03-27 DIAGNOSIS — H52223 Regular astigmatism, bilateral: Secondary | ICD-10-CM | POA: Diagnosis not present

## 2024-03-27 DIAGNOSIS — Z961 Presence of intraocular lens: Secondary | ICD-10-CM | POA: Diagnosis not present

## 2024-04-01 DIAGNOSIS — C50311 Malignant neoplasm of lower-inner quadrant of right female breast: Secondary | ICD-10-CM | POA: Diagnosis not present

## 2024-04-01 DIAGNOSIS — Z170 Estrogen receptor positive status [ER+]: Secondary | ICD-10-CM | POA: Diagnosis not present

## 2024-04-04 ENCOUNTER — Telehealth: Payer: Self-pay

## 2024-04-04 NOTE — Telephone Encounter (Signed)
 Patients patient assistance came in the mail, its at the downstairs nurses station, please call the patient

## 2024-04-24 DIAGNOSIS — H524 Presbyopia: Secondary | ICD-10-CM | POA: Diagnosis not present

## 2024-04-26 ENCOUNTER — Ambulatory Visit (INDEPENDENT_AMBULATORY_CARE_PROVIDER_SITE_OTHER): Admitting: Internal Medicine

## 2024-04-26 ENCOUNTER — Ambulatory Visit: Payer: Self-pay | Admitting: Internal Medicine

## 2024-04-26 ENCOUNTER — Encounter: Payer: Self-pay | Admitting: Internal Medicine

## 2024-04-26 VITALS — BP 132/64 | HR 70 | Ht 66.0 in | Wt 208.0 lb

## 2024-04-26 DIAGNOSIS — F558 Abuse of other non-psychoactive substances: Secondary | ICD-10-CM | POA: Diagnosis not present

## 2024-04-26 DIAGNOSIS — E1165 Type 2 diabetes mellitus with hyperglycemia: Secondary | ICD-10-CM | POA: Diagnosis not present

## 2024-04-26 DIAGNOSIS — I10 Essential (primary) hypertension: Secondary | ICD-10-CM

## 2024-04-26 DIAGNOSIS — E538 Deficiency of other specified B group vitamins: Secondary | ICD-10-CM

## 2024-04-26 DIAGNOSIS — Z1211 Encounter for screening for malignant neoplasm of colon: Secondary | ICD-10-CM | POA: Insufficient documentation

## 2024-04-26 DIAGNOSIS — E039 Hypothyroidism, unspecified: Secondary | ICD-10-CM | POA: Diagnosis not present

## 2024-04-26 DIAGNOSIS — Z0130 Encounter for examination of blood pressure without abnormal findings: Secondary | ICD-10-CM

## 2024-04-26 DIAGNOSIS — R748 Abnormal levels of other serum enzymes: Secondary | ICD-10-CM | POA: Diagnosis not present

## 2024-04-26 LAB — POCT CBG (FASTING - GLUCOSE)-MANUAL ENTRY: Glucose Fasting, POC: 157 mg/dL — AB (ref 70–99)

## 2024-04-26 NOTE — Progress Notes (Signed)
 Established Patient Office Visit  Subjective:  Patient ID: Sheri Bennett, female    DOB: 09-14-1945  Age: 78 y.o. MRN: [id number]  No chief complaint on file.   Patient is here today for follow up. She reports feeling tired and relates it to increased stress at home and being inconsistent with her diet control. Patient was getting Jardiance samples from the office as her insurance does not cover the medication but she reports it has been awhile since she took it. Provided patient with more samples for Jardiance today. She reports taking Rybelus 7 mg daily but reports she got a notice in January that the insurance company will not be covering that medication any longer. Reinforced need for strict diet control as patient could result to needing insulin for glycemic control and uncontrolled blood sugars can cause worsened kidney disease. Patient reports she will not use insulin and if it gets to the point she needs dialysis in the future she does not want that treatment option as well.  Patients blood pressure is slightly elevated today. She has taken her medications as prescribed without side effects.   She recently saw oncology for her breast cancer follow up.  Patient reports she had run out of her Aspirin and substituted that with acetaminophen. She did not realize they were not the same medications and her recent liver enzymes were more elevated than before. Will check routine fasting blood work today as patient is due and add on liver function and hepatitis B/C. Reinforced discontinuation of acetaminophen. However she does have a history of elevated liver enzymes and had a Negative ultrasound of liver before.  Patient is up to date on her diabetic eye exam. She is due for colon cancer screening; will order cologuard. Denies headache, chest pain, shortness of breath, abdominal pain.     No other concerns at this time.   Past Medical History:  Diagnosis Date   CAD S/P percutaneous  coronary angioplasty    Diabetes mellitus    Hyperlipemia    Hypertension    Malignant neoplasm of right breast [facility])     Past Surgical History:  Procedure Laterality Date   TONSILLECTOMY      Social History   Socioeconomic History   Marital status: Widowed    Spouse name: Not on file   Number of children: Not on file   Years of education: Not on file   Highest education level: Not on file  Occupational History   Not on file  Tobacco Use   Smoking status: Never   Smokeless tobacco: Not on file  Substance and Sexual Activity   Alcohol use: No   Drug use: No   Sexual activity: Never  Other Topics Concern   Not on file  Social History Narrative   Not on file   Social Drivers of Health   Financial Resource Strain: Patient Declined (08/17/2023)   Received from [facility]   Overall Financial Resource Strain (CARDIA)    Difficulty of Paying Living Expenses: Patient declined  Food Insecurity: Patient Declined (08/17/2023)   Received from [facility]   Hunger Vital Sign    Within the past 12 months, you worried that your food would run out before you got the money to buy more.: Patient declined    Within the past 12 months, the food you bought just didn't last and you didn't have money to get more.: Patient declined  Transportation Needs: Patient Declined (08/17/2023)   Received from [facility]  PRAPARE - Administrator, Civil Service (Medical): Patient declined    Lack of Transportation (Non-Medical): Patient declined  Physical Activity: Sufficiently Active (08/01/2021)   Received from [facility]   Exercise Vital Sign    On average, how many days per week do you engage in moderate to strenuous exercise (like a brisk walk)?: 3 days    On average, how many minutes do you engage in exercise at this level?: 120 min  Stress: No Stress Concern Present (08/05/2021)   Received from [facility] of Occupational Health - Occupational Stress  Questionnaire    Feeling of Stress : Only a little  Social Connections: Unknown (11/20/2021)   Received from [facility]   Social Network    Social Network: Not on file  Intimate Partner Violence: Unknown (10/11/2021)   Received from [facility]   HITS    Physically Hurt: Not on file    Insult or Talk Down To: Not on file    Threaten Physical Harm: Not on file    Scream or Curse: Not on file    No family history on file.  Allergies  Allergen Reactions   Benadryl [Diphenhydramine]    Codeine     Outpatient Medications Prior to Visit  Medication Sig   atorvastatin (LIPITOR) 80 MG tablet TAKE 1 TABLET EVERY DAY   Calcium Carbonate-Vitamin D (CALTRATE 600+D) 600-400 MG-UNIT per tablet Take 1 tablet by mouth daily. (Patient not taking: Reported on 01/25/2024)   celecoxib  (CELEBREX ) 200 MG capsule Take 1 capsule (200 mg total) by mouth daily. (Patient not taking: Reported on 01/25/2024)   clopidogrel (PLAVIX) 75 MG tablet TAKE 1 TABLET EVERY DAY   empagliflozin (JARDIANCE) 25 MG TABS tablet TAKE 1 TABLET BY MOUTH EVERY DAY   fish oil-omega-3 fatty acids 1000 MG capsule Take 1 g by mouth daily. (Patient not taking: Reported on 01/25/2024)   fluticasone  (FLONASE ) 50 MCG/ACT nasal spray Place 2 sprays into both nostrils daily.   glimepiride  (AMARYL ) 2 MG tablet Take 1 tablet (2 mg total) by mouth 2 (two) times daily.   levothyroxine  (LEVOXYL ) 137 MCG tablet Take 1 tablet (137 mcg total) by mouth daily before breakfast.   Lifitegrast  (XIIDRA ) 5 % SOLN One drop in each eye every 12 hours (Patient not taking: Reported on 01/25/2024)   liothyronine  (CYTOMEL ) 5 MCG tablet Take 1 tablet (5 mcg total) by mouth daily. (Patient not taking: Reported on 01/25/2024)   LORazepam  (ATIVAN ) 1 MG tablet TAKE 1 TABLET AT BEDTIME AS NEEDED FOR ANXIETY   losartan-hydrochlorothiazide (HYZAAR) 100-25 MG tablet TAKE 1 TABLET EVERY DAY   metoprolol tartrate (LOPRESSOR) 50 MG tablet TAKE 1 TABLET TWICE DAILY    pantoprazole (PROTONIX) 40 MG tablet TAKE 1 TABLET EVERY DAY   potassium chloride  SA (KLOR-CON  M) 20 MEQ tablet TAKE 1 TABLET EVERY DAY   Semaglutide  (RYBELSUS ) 7 MG TABS Take 1 tablet (7 mg total) by mouth daily.   No facility-administered medications prior to visit.    Review of Systems  Constitutional:  Positive for malaise/fatigue. Negative for chills and fever.  HENT: Negative.  Negative for congestion and sore throat.   Eyes: Negative.  Negative for blurred vision and pain.  Respiratory: Negative.  Negative for cough and shortness of breath.   Cardiovascular: Negative.  Negative for chest pain, palpitations and leg swelling.  Gastrointestinal: Negative.  Negative for abdominal pain, blood in stool, constipation, diarrhea, heartburn, melena, nausea and vomiting.  Genitourinary: Negative.  Negative for dysuria, flank pain, frequency and urgency.  Musculoskeletal: Negative.  Negative for joint pain and myalgias.  Skin: Negative.   Neurological: Negative.  Negative for dizziness, tingling, sensory change, weakness and headaches.  Endo/Heme/Allergies: Negative.   Psychiatric/Behavioral: Negative.  Negative for depression and suicidal ideas. The patient is not nervous/anxious.        Objective:   BP 132/64   Pulse 70   Ht 5' 6 (1.676 m)   Wt 208 lb (94.3 kg)   SpO2 99%   BMI 33.57 kg/m   Vitals:   04/26/24 1103  BP: 132/64  Pulse: 70  Height: 5' 6 (1.676 m)  Weight: 208 lb (94.3 kg)  SpO2: 99%  BMI (Calculated): 33.59    Physical Exam Vitals and nursing note reviewed.  Constitutional:      Appearance: Normal appearance.  HENT:     Head: Normocephalic and atraumatic.     Nose: Nose normal.     Mouth/Throat:     Mouth: Mucous membranes are moist.     Pharynx: Oropharynx is clear.  Eyes:     Conjunctiva/sclera: Conjunctivae normal.     Pupils: Pupils are equal, round, and reactive to light.  Cardiovascular:     Rate and Rhythm: Normal rate and regular rhythm.      Pulses: Normal pulses.     Heart sounds: Normal heart sounds. No murmur heard. Pulmonary:     Effort: Pulmonary effort is normal.     Breath sounds: Normal breath sounds. No wheezing.  Abdominal:     General: Bowel sounds are normal.     Palpations: Abdomen is soft.     Tenderness: There is no abdominal tenderness. There is no right CVA tenderness or left CVA tenderness.  Musculoskeletal:        General: Normal range of motion.     Cervical back: Normal range of motion.     Right lower leg: No edema.     Left lower leg: No edema.  Skin:    General: Skin is warm and dry.  Neurological:     General: No focal deficit present.     Mental Status: She is alert and oriented to person, place, and time.  Psychiatric:        Mood and Affect: Mood normal.        Behavior: Behavior normal.      Results for orders placed or performed in visit on 04/26/24  POCT CBG (Fasting - Glucose)  Result Value Ref Range   Glucose Fasting, POC 157 (A) 70 - 99 mg/dL    Recent Results (from the past 2160 hours)  POCT CBG (Fasting - Glucose)     Status: Abnormal   Collection Time: 04/26/24 11:11 AM  Result Value Ref Range   Glucose Fasting, POC 157 (A) 70 - 99 mg/dL    Comment: non-fasting      Assessment & Plan:  Continue taking medications as prescribed. Reinforced strict diet control and exercise as tolerated. Cologuard ordered. Problem List Items Addressed This Visit     B12 deficiency   Relevant Orders   CBC with Diff   Essential hypertension, benign   Relevant Orders   CMP14+EGFR   Iron, TIBC and Ferritin Panel   Type 2 diabetes mellitus with hyperglycemia, without long-term current use of insulin (HCC) - Primary   Relevant Orders   POCT CBG (Fasting - Glucose) (Completed)   Hemoglobin A1c   Hypothyroidism   Relevant Orders   TSH+T4F+T3Free  Colon cancer screening   Relevant Orders   Cologuard   Acetaminophen abuse   Relevant Orders   Liver Profile   Elevated liver  enzymes   Relevant Orders   Hepatitis C antibody   Hepatitis B Surface Antibody    Follow up 3 months   Total time spent: 30 minutes. This time includes review of previous notes and results and patient face to face interaction during today's visit.    FERNAND FREDY RAMAN, MD  04/26/2024   This document may have been prepared by [facility] Voice Recognition software and as such may include unintentional dictation errors.

## 2024-04-27 LAB — CMP14+EGFR
ALT: 12 IU/L (ref 0–32)
AST: 21 IU/L (ref 0–40)
Albumin: 4.5 g/dL (ref 3.8–4.8)
Alkaline Phosphatase: 143 IU/L — ABNORMAL HIGH (ref 49–135)
BUN/Creatinine Ratio: 13 (ref 12–28)
BUN: 13 mg/dL (ref 8–27)
Bilirubin Total: 0.4 mg/dL (ref 0.0–1.2)
CO2: 24 mmol/L (ref 20–29)
Calcium: 9.1 mg/dL (ref 8.7–10.3)
Chloride: 100 mmol/L (ref 96–106)
Creatinine, Ser: 1.04 mg/dL — ABNORMAL HIGH (ref 0.57–1.00)
Globulin, Total: 2.5 g/dL (ref 1.5–4.5)
Glucose: 131 mg/dL — ABNORMAL HIGH (ref 70–99)
Potassium: 3.3 mmol/L — ABNORMAL LOW (ref 3.5–5.2)
Sodium: 142 mmol/L (ref 134–144)
Total Protein: 7 g/dL (ref 6.0–8.5)
eGFR: 55 mL/min/1.73 — ABNORMAL LOW (ref >59)

## 2024-04-27 LAB — HEPATIC FUNCTION PANEL
ALT: 18 IU/L (ref 0–32)
AST: 21 IU/L (ref 0–40)
Albumin: 4.3 g/dL (ref 3.8–4.8)
Alkaline Phosphatase: 142 IU/L — ABNORMAL HIGH (ref 49–135)
Bilirubin Total: 0.4 mg/dL (ref 0.0–1.2)
Bilirubin, Direct: 0.13 mg/dL (ref 0.00–0.40)
Total Protein: 6.9 g/dL (ref 6.0–8.5)

## 2024-04-27 LAB — HEPB+HEPC+HIV PANEL
HIV Screen 4th Generation wRfx: NONREACTIVE
Hep B C IgM: NEGATIVE
Hep B Core Total Ab: NEGATIVE
Hep B E Ab: NONREACTIVE
Hep B E Ag: NEGATIVE
Hep B Surface Ab, Qual: NONREACTIVE
Hep C Virus Ab: NONREACTIVE
Hepatitis B Surface Ag: NEGATIVE

## 2024-04-27 LAB — CBC WITH DIFFERENTIAL/PLATELET
Basophils Absolute: 0 x10E3/uL (ref 0.0–0.2)
Basos: 0 %
EOS (ABSOLUTE): 0 x10E3/uL (ref 0.0–0.4)
Eos: 0 %
Hematocrit: 42 % (ref 34.0–46.6)
Hemoglobin: 12.8 g/dL (ref 11.1–15.9)
Immature Grans (Abs): 0 x10E3/uL (ref 0.0–0.1)
Immature Granulocytes: 0 %
Lymphocytes Absolute: 1 x10E3/uL (ref 0.7–3.1)
Lymphs: 25 %
MCH: 24.9 pg — ABNORMAL LOW (ref 26.6–33.0)
MCHC: 30.5 g/dL — ABNORMAL LOW (ref 31.5–35.7)
MCV: 82 fL (ref 79–97)
Monocytes Absolute: 0.5 x10E3/uL (ref 0.1–0.9)
Monocytes: 14 %
Neutrophils Absolute: 2.4 x10E3/uL (ref 1.4–7.0)
Neutrophils: 61 %
Platelets: 247 x10E3/uL (ref 150–450)
RBC: 5.14 x10E6/uL (ref 3.77–5.28)
RDW: 15.5 % — ABNORMAL HIGH (ref 11.7–15.4)
WBC: 3.9 x10E3/uL (ref 3.4–10.8)

## 2024-04-27 LAB — HEMOGLOBIN A1C
Est. average glucose Bld gHb Est-mCnc: 157 mg/dL
Hgb A1c MFr Bld: 7.1 % — ABNORMAL HIGH (ref 4.8–5.6)

## 2024-04-27 LAB — IRON,TIBC AND FERRITIN PANEL
Ferritin: 46 ng/mL (ref 15–150)
Iron Saturation: 8 % — CL (ref 15–55)
Iron: 36 ug/dL (ref 27–139)
Total Iron Binding Capacity: 435 ug/dL (ref 250–450)
UIBC: 399 ug/dL — ABNORMAL HIGH (ref 118–369)

## 2024-04-27 LAB — TSH+T4F+T3FREE
Free T4: 1.4 ng/dL (ref 0.82–1.77)
T3, Free: 2.5 pg/mL (ref 2.0–4.4)
TSH: 0.966 u[IU]/mL (ref 0.450–4.500)

## 2024-05-07 NOTE — Progress Notes (Signed)
 Unable to lm, mailbox full

## 2024-05-24 ENCOUNTER — Telehealth: Payer: Self-pay

## 2024-05-24 NOTE — Progress Notes (Signed)
   05/24/2024  Patient ID: Sheri Bennett, female   DOB: July 30, 1945, 78 y.o.   MRN: [id number]  Pharmacy Quality Measure Review  This patient is appearing on a report for being at risk of failing the adherence measure for diabetes medications this calendar year.   Medication: Glimepiride  2mg  Last fill date: 01/15/24 for 90 day supply  Spoke with patient. She plans to call [facility] today to coordinate refills on all her medications, including the glimepiride .  Jon VEAR Lindau, PharmD Clinical Pharmacist [phone]

## 2024-05-27 ENCOUNTER — Other Ambulatory Visit: Payer: Self-pay | Admitting: Internal Medicine

## 2024-05-27 ENCOUNTER — Other Ambulatory Visit: Payer: Self-pay | Admitting: Cardiovascular Disease

## 2024-05-27 DIAGNOSIS — E1165 Type 2 diabetes mellitus with hyperglycemia: Secondary | ICD-10-CM

## 2024-05-27 DIAGNOSIS — E039 Hypothyroidism, unspecified: Secondary | ICD-10-CM

## 2024-05-27 DIAGNOSIS — E782 Mixed hyperlipidemia: Secondary | ICD-10-CM

## 2024-05-27 DIAGNOSIS — I10 Essential (primary) hypertension: Secondary | ICD-10-CM

## 2024-05-27 DIAGNOSIS — E1159 Type 2 diabetes mellitus with other circulatory complications: Secondary | ICD-10-CM

## 2024-07-29 ENCOUNTER — Ambulatory Visit: Admitting: Internal Medicine

## 2024-07-29 ENCOUNTER — Encounter: Payer: Self-pay | Admitting: Internal Medicine

## 2024-07-29 VITALS — BP 128/68 | HR 90 | Ht 66.0 in | Wt 211.6 lb

## 2024-07-29 DIAGNOSIS — E1165 Type 2 diabetes mellitus with hyperglycemia: Secondary | ICD-10-CM | POA: Diagnosis not present

## 2024-07-29 DIAGNOSIS — Z0001 Encounter for general adult medical examination with abnormal findings: Secondary | ICD-10-CM | POA: Diagnosis not present

## 2024-07-29 DIAGNOSIS — Z13820 Encounter for screening for osteoporosis: Secondary | ICD-10-CM

## 2024-07-29 DIAGNOSIS — Z170 Estrogen receptor positive status [ER+]: Secondary | ICD-10-CM

## 2024-07-29 DIAGNOSIS — E1169 Type 2 diabetes mellitus with other specified complication: Secondary | ICD-10-CM | POA: Diagnosis not present

## 2024-07-29 DIAGNOSIS — C50311 Malignant neoplasm of lower-inner quadrant of right female breast: Secondary | ICD-10-CM

## 2024-07-29 DIAGNOSIS — E538 Deficiency of other specified B group vitamins: Secondary | ICD-10-CM | POA: Diagnosis not present

## 2024-07-29 DIAGNOSIS — Z1211 Encounter for screening for malignant neoplasm of colon: Secondary | ICD-10-CM | POA: Diagnosis not present

## 2024-07-29 DIAGNOSIS — I10 Essential (primary) hypertension: Secondary | ICD-10-CM

## 2024-07-29 DIAGNOSIS — E039 Hypothyroidism, unspecified: Secondary | ICD-10-CM | POA: Diagnosis not present

## 2024-07-29 DIAGNOSIS — E782 Mixed hyperlipidemia: Secondary | ICD-10-CM | POA: Diagnosis not present

## 2024-07-29 DIAGNOSIS — Z0000 Encounter for general adult medical examination without abnormal findings: Secondary | ICD-10-CM

## 2024-07-29 LAB — POCT CBG (FASTING - GLUCOSE)-MANUAL ENTRY: Glucose Fasting, POC: 185 mg/dL — AB (ref 70–99)

## 2024-07-29 NOTE — Progress Notes (Signed)
 "  Established Patient Office Visit  Subjective:  Patient ID: Sheri Bennett, female    DOB: [birth year]  Age: 79 y.o. MRN: [id number]  Chief Complaint  Patient presents with   Follow-up    3 month follow up    Patient comes in for her AWV.  She is generally feeling well and has no new complaints.  Denies chest pain, no shortness of breath, no palpitations.  Her mammograms are done regularly at her cancer center.  She is overdue for a DEXA scan, will schedule. Patient had Cologuard ordered twice but did not complete the test.  Today she agrees that she will complete and we will order it again today. Her PHQ-9/GAD score is 0 CIT score is 2. Her her diabetic eye exam is up-to-date.  As well as her urine microalbumin.    No other concerns at this time.   Past Medical History:  Diagnosis Date   CAD S/P percutaneous coronary angioplasty    Diabetes mellitus    Hyperlipemia    Hypertension    Malignant neoplasm of right breast [facility])     Past Surgical History:  Procedure Laterality Date   TONSILLECTOMY      Social History   Socioeconomic History   Marital status: Widowed    Spouse name: Not on file   Number of children: Not on file   Years of education: Not on file   Highest education level: Not on file  Occupational History   Not on file  Tobacco Use   Smoking status: Never   Smokeless tobacco: Not on file  Substance and Sexual Activity   Alcohol use: No   Drug use: No   Sexual activity: Never  Other Topics Concern   Not on file  Social History Narrative   Not on file   Social Drivers of Health   Tobacco Use: Unknown (07/29/2024)   Patient History    Smoking Tobacco Use: Never    Smokeless Tobacco Use: Unknown    Passive Exposure: Not on file  Recent Concern: Tobacco Use - Medium Risk (06/24/2024)   Received from [facility]   Patient History    Smoking Tobacco Use: Former    Smokeless Tobacco Use: Never    Passive Exposure: Not on file  Financial  Resource Strain: Patient Declined (08/17/2023)   Received from [facility]   Overall Financial Resource Strain (CARDIA)    Difficulty of Paying Living Expenses: Patient declined  Food Insecurity: No Food Insecurity (07/29/2024)   Epic    Worried About Radiation Protection Practitioner of Food in the Last Year: Never true    Ran Out of Food in the Last Year: Never true  Transportation Needs: No Transportation Needs (07/29/2024)   Epic    Lack of Transportation (Medical): No    Lack of Transportation (Non-Medical): No  Physical Activity: Sufficiently Active (08/01/2021)   Received from [facility]   Exercise Vital Sign    On average, how many days per week do you engage in moderate to strenuous exercise (like a brisk walk)?: 3 days    On average, how many minutes do you engage in exercise at this level?: 120 min  Stress: No Stress Concern Present (07/29/2024)   [employer] - Occupational Stress Questionnaire    Feeling of Stress: Not at all  Social Connections: Not on file  Intimate Partner Violence: Not At Risk (07/29/2024)   Epic    Fear of Current or Ex-Partner: No    Emotionally  Abused: No    Physically Abused: No    Sexually Abused: No  Depression (PHQ2-9): Low Risk (07/29/2024)   Depression (PHQ2-9)    PHQ-2 Score: 0  Alcohol Screen: Low Risk (07/29/2024)   Alcohol Screen    Last Alcohol Screening Score (AUDIT): 2  Housing: Unknown (07/29/2024)   Epic    Unable to Pay for Housing in the Last Year: No    Number of Times Moved in the Last Year: Not on file    Homeless in the Last Year: No  Utilities: Not At Risk (07/29/2024)   Epic    Threatened with loss of utilities: No  Health Literacy: Adequate Health Literacy (07/29/2024)   B1300 Health Literacy    Frequency of need for help with medical instructions: Never    History reviewed. No pertinent family history.  Allergies[1]  Show/hide medication list[2]  Review of Systems  Constitutional: Negative.  Negative for  chills, fever and malaise/fatigue.  HENT: Negative.  Negative for congestion and sore throat.   Eyes: Negative.  Negative for blurred vision and pain.  Respiratory: Negative.  Negative for cough and shortness of breath.   Cardiovascular: Negative.  Negative for chest pain, palpitations and leg swelling.  Gastrointestinal: Negative.  Negative for abdominal pain, blood in stool, constipation, diarrhea, heartburn, melena, nausea and vomiting.  Genitourinary: Negative.  Negative for dysuria, flank pain, frequency and urgency.  Musculoskeletal: Negative.  Negative for joint pain and myalgias.  Skin: Negative.   Neurological: Negative.  Negative for dizziness, tingling, sensory change, weakness and headaches.  Endo/Heme/Allergies: Negative.   Psychiatric/Behavioral: Negative.  Negative for depression and suicidal ideas. The patient is not nervous/anxious.        Objective:   BP 128/68   Pulse 90   Ht 5' 6 (1.676 m)   Wt 211 lb 9.6 oz (96 kg)   SpO2 98%   BMI 34.15 kg/m   Vitals:   07/29/24 1057  BP: 128/68  Pulse: 90  Height: 5' 6 (1.676 m)  Weight: 211 lb 9.6 oz (96 kg)  SpO2: 98%  BMI (Calculated): 34.17    Physical Exam Vitals and nursing note reviewed.  Constitutional:      Appearance: Normal appearance.  HENT:     Head: Normocephalic and atraumatic.     Nose: Nose normal.     Mouth/Throat:     Mouth: Mucous membranes are moist.     Pharynx: Oropharynx is clear.  Eyes:     Conjunctiva/sclera: Conjunctivae normal.     Pupils: Pupils are equal, round, and reactive to light.  Cardiovascular:     Rate and Rhythm: Normal rate and regular rhythm.     Pulses: Normal pulses.     Heart sounds: Normal heart sounds. No murmur heard. Pulmonary:     Effort: Pulmonary effort is normal.     Breath sounds: Normal breath sounds. No wheezing.  Abdominal:     General: Bowel sounds are normal.     Palpations: Abdomen is soft.     Tenderness: There is no abdominal tenderness.  There is no right CVA tenderness or left CVA tenderness.  Musculoskeletal:        General: Normal range of motion.     Cervical back: Normal range of motion.     Right lower leg: No edema.     Left lower leg: No edema.  Skin:    General: Skin is warm and dry.  Neurological:     General: No focal deficit present.  Mental Status: She is alert and oriented to person, place, and time.  Psychiatric:        Mood and Affect: Mood normal.        Behavior: Behavior normal.      Results for orders placed or performed in visit on 07/29/24  POCT CBG (Fasting - Glucose)  Result Value Ref Range   Glucose Fasting, POC 185 (A) 70 - 99 mg/dL    Recent Results (from the past 2160 hours)  POCT CBG (Fasting - Glucose)     Status: Abnormal   Collection Time: 07/29/24 11:03 AM  Result Value Ref Range   Glucose Fasting, POC 185 (A) 70 - 99 mg/dL      Assessment & Plan:  Check fasting labs today.  Set up Cologuard and DEXA scan. Problem List Items Addressed This Visit       Cardiovascular and Mediastinum   Essential hypertension, benign   Relevant Orders   CMP14+EGFR   Iron, TIBC and Ferritin Panel     Endocrine   Type 2 diabetes mellitus with hyperglycemia, without long-term current use of insulin (HCC)   Relevant Orders   POCT CBG (Fasting - Glucose) (Completed)   Hemoglobin A1c   Hypothyroidism   Relevant Orders   TSH+T4F+T3Free   Combined hyperlipidemia associated with type 2 diabetes mellitus (HCC)     Other   B12 deficiency   Relevant Orders   CBC with Diff   Malignant neoplasm of lower-inner quadrant of right breast of female, estrogen receptor positive (HCC)   Mixed hyperlipidemia   Relevant Orders   Lipid Panel w/o Chol/HDL Ratio   Colon cancer screening   Relevant Orders   Cologuard   Other Visit Diagnoses       Medicare annual wellness visit, subsequent    -  Primary     Screening for osteoporosis       Relevant Orders   DG Bone Density       Return in  about 3 months (around 10/27/2024).   Total time spent: 30 minutes. This time includes review of previous notes and results and patient face to face interaction during today's visit.    FERNAND FREDY RAMAN, MD  07/29/2024   This document may have been prepared by [facility] Voice Recognition software and as such may include unintentional dictation errors.     [1]  Allergies Allergen Reactions   Benadryl [Diphenhydramine]    Codeine   [2]  Outpatient Medications Prior to Visit  Medication Sig   atorvastatin (LIPITOR) 80 MG tablet TAKE 1 TABLET EVERY DAY   celecoxib  (CELEBREX ) 200 MG capsule Take 1 capsule (200 mg total) by mouth daily. (Patient taking differently: Take 200 mg by mouth as needed.)   clopidogrel (PLAVIX) 75 MG tablet TAKE 1 TABLET EVERY DAY   empagliflozin (JARDIANCE) 25 MG TABS tablet TAKE 1 TABLET BY MOUTH EVERY DAY   fluticasone  (FLONASE ) 50 MCG/ACT nasal spray Place 2 sprays into both nostrils daily. (Patient taking differently: Place 2 sprays into both nostrils as needed.)   glimepiride  (AMARYL ) 2 MG tablet Take 1 tablet (2 mg total) by mouth 2 (two) times daily.   liothyronine  (CYTOMEL ) 5 MCG tablet TAKE 1 TABLET EVERY DAY   LORazepam  (ATIVAN ) 1 MG tablet TAKE 1 TABLET AT BEDTIME AS NEEDED FOR ANXIETY (Patient taking differently: Take 1 mg by mouth as needed.)   losartan-hydrochlorothiazide (HYZAAR) 100-25 MG tablet TAKE 1 TABLET EVERY DAY   metoprolol tartrate (LOPRESSOR) 50 MG tablet TAKE 1 TABLET  TWICE DAILY   pantoprazole (PROTONIX) 40 MG tablet TAKE 1 TABLET EVERY DAY   potassium chloride  SA (KLOR-CON  M) 20 MEQ tablet TAKE 1 TABLET EVERY DAY   RYBELSUS  7 MG TABS TAKE 1 TABLET EVERY DAY   Calcium Carbonate-Vitamin D (CALTRATE 600+D) 600-400 MG-UNIT per tablet Take 1 tablet by mouth daily. (Patient not taking: Reported on 07/29/2024)   fish oil-omega-3 fatty acids 1000 MG capsule Take 1 g by mouth daily. (Patient not taking: Reported on 07/29/2024)   levothyroxine   (SYNTHROID ) 137 MCG tablet TAKE 1 TABLET EVERY DAY BEFORE BREAKFAST (Patient not taking: Reported on 07/29/2024)   Lifitegrast  (XIIDRA ) 5 % SOLN One drop in each eye every 12 hours (Patient not taking: Reported on 07/29/2024)   No facility-administered medications prior to visit.   "

## 2024-07-30 ENCOUNTER — Ambulatory Visit: Payer: Self-pay | Admitting: Internal Medicine

## 2024-07-30 ENCOUNTER — Telehealth: Payer: Self-pay

## 2024-07-30 LAB — CMP14+EGFR
ALT: 15 IU/L (ref 0–32)
AST: 19 IU/L (ref 0–40)
Albumin: 4.2 g/dL (ref 3.8–4.8)
Alkaline Phosphatase: 139 IU/L — ABNORMAL HIGH (ref 49–135)
BUN/Creatinine Ratio: 12 (ref 12–28)
BUN: 11 mg/dL (ref 8–27)
Bilirubin Total: 0.4 mg/dL (ref 0.0–1.2)
CO2: 24 mmol/L (ref 20–29)
Calcium: 9.1 mg/dL (ref 8.7–10.3)
Chloride: 103 mmol/L (ref 96–106)
Creatinine, Ser: 0.89 mg/dL (ref 0.57–1.00)
Globulin, Total: 2.2 g/dL (ref 1.5–4.5)
Glucose: 118 mg/dL — ABNORMAL HIGH (ref 70–99)
Potassium: 3.8 mmol/L (ref 3.5–5.2)
Sodium: 143 mmol/L (ref 134–144)
Total Protein: 6.4 g/dL (ref 6.0–8.5)
eGFR: 67 mL/min/1.73

## 2024-07-30 LAB — CBC WITH DIFFERENTIAL/PLATELET
Basophils Absolute: 0 x10E3/uL (ref 0.0–0.2)
Basos: 0 %
EOS (ABSOLUTE): 0 x10E3/uL (ref 0.0–0.4)
Eos: 0 %
Hematocrit: 40.4 % (ref 34.0–46.6)
Hemoglobin: 12.5 g/dL (ref 11.1–15.9)
Immature Grans (Abs): 0 x10E3/uL (ref 0.0–0.1)
Immature Granulocytes: 0 %
Lymphocytes Absolute: 1.2 x10E3/uL (ref 0.7–3.1)
Lymphs: 19 %
MCH: 25 pg — ABNORMAL LOW (ref 26.6–33.0)
MCHC: 30.9 g/dL — ABNORMAL LOW (ref 31.5–35.7)
MCV: 81 fL (ref 79–97)
Monocytes Absolute: 0.6 x10E3/uL (ref 0.1–0.9)
Monocytes: 10 %
Neutrophils Absolute: 4.4 x10E3/uL (ref 1.4–7.0)
Neutrophils: 71 %
Platelets: 256 x10E3/uL (ref 150–450)
RBC: 5.01 x10E6/uL (ref 3.77–5.28)
RDW: 14.6 % (ref 11.7–15.4)
WBC: 6.2 x10E3/uL (ref 3.4–10.8)

## 2024-07-30 LAB — TSH+T4F+T3FREE
Free T4: 1.23 ng/dL (ref 0.82–1.77)
T3, Free: 2.2 pg/mL (ref 2.0–4.4)
TSH: 4.47 u[IU]/mL (ref 0.450–4.500)

## 2024-07-30 LAB — HEMOGLOBIN A1C
Est. average glucose Bld gHb Est-mCnc: 151 mg/dL
Hgb A1c MFr Bld: 6.9 % — ABNORMAL HIGH (ref 4.8–5.6)

## 2024-07-30 LAB — LIPID PANEL W/O CHOL/HDL RATIO
Cholesterol, Total: 160 mg/dL (ref 100–199)
HDL: 49 mg/dL
LDL Chol Calc (NIH): 86 mg/dL (ref 0–99)
Triglycerides: 142 mg/dL (ref 0–149)
VLDL Cholesterol Cal: 25 mg/dL (ref 5–40)

## 2024-07-30 LAB — IRON,TIBC AND FERRITIN PANEL
Ferritin: 25 ng/mL (ref 15–150)
Iron Saturation: 12 % — ABNORMAL LOW (ref 15–55)
Iron: 49 ug/dL (ref 27–139)
Total Iron Binding Capacity: 409 ug/dL (ref 250–450)
UIBC: 360 ug/dL (ref 118–369)

## 2024-07-30 NOTE — Progress Notes (Signed)
" ° °  07/30/2024  Patient ID: Sheri Bennett, female   DOB: [birth year], 79 y.o.   MRN: [id number]  Attempted to contact patient to discuss cost concerns with [employer]  and [employer]. Left HIPAA compliant message for patient to return my call at their convenience.   Jon VEAR Lindau, PharmD Clinical Pharmacist [phone]   "

## 2024-08-01 ENCOUNTER — Telehealth: Payer: Self-pay

## 2024-10-28 ENCOUNTER — Ambulatory Visit: Admitting: Internal Medicine
